# Patient Record
Sex: Male | Born: 1960 | Hispanic: Yes | Marital: Married | Smoking: Never smoker
Health system: Southern US, Community
[De-identification: ages and names within clinical notes are randomized; demographics above are authoritative.]

## PROBLEM LIST (undated history)

## (undated) DIAGNOSIS — M549 Dorsalgia, unspecified: Secondary | ICD-10-CM

---

## 2012-09-01 ENCOUNTER — Observation Stay: Payer: BC Managed Care – PPO

## 2012-09-01 ENCOUNTER — Emergency Department: Payer: BC Managed Care – PPO

## 2012-09-01 ENCOUNTER — Observation Stay: Payer: BC Managed Care – PPO | Admitting: Internal Medicine

## 2012-09-01 ENCOUNTER — Observation Stay
Admission: EM | Admit: 2012-09-01 | Discharge: 2012-09-02 | Disposition: A | Discharge status: Home / Self Care | Payer: BC Managed Care – PPO | Attending: Internal Medicine | Admitting: Internal Medicine

## 2012-09-01 DIAGNOSIS — G459 Transient cerebral ischemic attack, unspecified: Principal | ICD-10-CM | POA: Insufficient documentation

## 2012-09-01 DIAGNOSIS — E860 Dehydration: Secondary | ICD-10-CM | POA: Insufficient documentation

## 2012-09-01 DIAGNOSIS — F411 Generalized anxiety disorder: Secondary | ICD-10-CM | POA: Insufficient documentation

## 2012-09-01 DIAGNOSIS — R569 Unspecified convulsions: Secondary | ICD-10-CM

## 2012-09-01 DIAGNOSIS — F419 Anxiety disorder, unspecified: Secondary | ICD-10-CM

## 2012-09-01 LAB — CBC AND DIFFERENTIAL
Basophils Absolute Automated: 0.01 10*3/uL (ref 0.00–0.20)
Basophils Automated: 0 %
Eosinophils Absolute Automated: 0.33 10*3/uL (ref 0.00–0.70)
Eosinophils Automated: 6 %
Hematocrit: 44.2 % (ref 42.0–52.0)
Hgb: 15.2 g/dL (ref 13.0–17.0)
Immature Granulocytes Absolute: 0.01 10*3/uL
Immature Granulocytes: 0 %
Lymphocytes Absolute Automated: 2.07 10*3/uL (ref 0.50–4.40)
Lymphocytes Automated: 35 %
MCH: 29.4 pg (ref 28.0–32.0)
MCHC: 34.4 g/dL (ref 32.0–36.0)
MCV: 85.5 fL (ref 80.0–100.0)
MPV: 10.7 fL (ref 9.4–12.3)
Monocytes Absolute Automated: 0.49 10*3/uL (ref 0.00–1.20)
Monocytes: 8 %
Neutrophils Absolute: 3.05 10*3/uL (ref 1.80–8.10)
Neutrophils: 51 %
Nucleated RBC: 0 /100 WBC (ref 0–1)
Platelets: 197 10*3/uL (ref 140–400)
RBC: 5.17 10*6/uL (ref 4.70–6.00)
RDW: 14 % (ref 12–15)
WBC: 5.96 10*3/uL (ref 3.50–10.80)

## 2012-09-01 LAB — URINALYSIS WITH MICROSCOPIC
Bilirubin, UA: NEGATIVE
Blood, UA: NEGATIVE
Glucose, UA: NEGATIVE
Ketones UA: NEGATIVE
Leukocyte Esterase, UA: NEGATIVE
Nitrite, UA: NEGATIVE
Protein, UR: NEGATIVE
Specific Gravity UA: 1.016 (ref 1.001–1.035)
Urine pH: 7 (ref 5.0–8.0)
Urobilinogen, UA: NORMAL mg/dL

## 2012-09-01 LAB — BASIC METABOLIC PANEL
BUN: 26 mg/dL — ABNORMAL HIGH (ref 9.0–21.0)
CO2: 23 mEq/L (ref 22–29)
Calcium: 9.3 mg/dL (ref 8.5–10.5)
Chloride: 106 mEq/L (ref 98–107)
Creatinine: 1.1 mg/dL (ref 0.7–1.3)
Glucose: 87 mg/dL (ref 70–100)
Potassium: 4.3 mEq/L (ref 3.5–5.1)
Sodium: 137 mEq/L (ref 136–145)

## 2012-09-01 LAB — CKMB: Creatinine Kinase MB (CKMB): 1.4 ng/mL (ref 0.0–4.9)

## 2012-09-01 LAB — ECG 12-LEAD
Atrial Rate: 80 {beats}/min
P Axis: 63 degrees
P-R Interval: 170 ms
Q-T Interval: 372 ms
QRS Duration: 86 ms
QTC Calculation (Bezet): 429 ms
R Axis: 39 degrees
T Axis: 58 degrees
Ventricular Rate: 80 {beats}/min

## 2012-09-01 LAB — I-STAT TROPONIN: i-STAT Troponin: 0 ng/mL (ref 0.00–0.09)

## 2012-09-01 LAB — CK: Creatine Kinase (CK): 377 U/L — ABNORMAL HIGH (ref 30–200)

## 2012-09-01 LAB — GFR: EGFR: >60

## 2012-09-01 MED ORDER — SODIUM CHLORIDE 0.9 % IV SOLN
INTRAVENOUS | Status: DC
Start: 2012-09-01 — End: 2012-09-02

## 2012-09-01 MED ORDER — ASPIRIN 325 MG PO TABS
325.0000 mg | ORAL_TABLET | Freq: Once | ORAL | Status: AC
Start: 2012-09-01 — End: 2012-09-01
  Administered 2012-09-01: 325 mg via ORAL
  Filled 2012-09-01: qty 1

## 2012-09-01 MED ORDER — GADOBUTROL 1 MMOL/ML IV SOLN
0.1000 mmol/kg | Freq: Once | INTRAVENOUS | Status: AC | PRN
Start: 2012-09-01 — End: 2012-09-01
  Administered 2012-09-01: 7 mmol via INTRAVENOUS
  Filled 2012-09-01: qty 7.5

## 2012-09-01 MED ORDER — OXYCODONE-ACETAMINOPHEN 5-325 MG PO TABS
1.0000 | ORAL_TABLET | ORAL | Status: DC | PRN
Start: 2012-09-01 — End: 2012-09-02

## 2012-09-01 MED ORDER — LABETALOL HCL 5 MG/ML IV SOLN
10.0000 mg | INTRAVENOUS | Status: DC | PRN
Start: 2012-09-01 — End: 2012-09-02

## 2012-09-01 MED ORDER — HEPARIN SODIUM (PORCINE) 5000 UNIT/ML IJ SOLN
5000.0000 [IU] | Freq: Three times a day (TID) | INTRAMUSCULAR | Status: DC
Start: 2012-09-01 — End: 2012-09-02
  Administered 2012-09-01 – 2012-09-02 (×3): 5000 [IU] via SUBCUTANEOUS
  Filled 2012-09-01 (×4): qty 1

## 2012-09-01 MED ORDER — ASPIRIN 325 MG PO TBEC
325.0000 mg | DELAYED_RELEASE_TABLET | Freq: Every day | ORAL | Status: DC
Start: 2012-09-01 — End: 2012-09-02
  Administered 2012-09-01 – 2012-09-02 (×2): 325 mg via ORAL
  Filled 2012-09-01 (×2): qty 1

## 2012-09-01 MED ORDER — SIMVASTATIN 10 MG PO TABS
40.0000 mg | ORAL_TABLET | Freq: Every evening | ORAL | Status: DC
Start: 2012-09-01 — End: 2012-09-02
  Administered 2012-09-01: 40 mg via ORAL
  Filled 2012-09-01: qty 4

## 2012-09-01 NOTE — Progress Notes (Signed)
Pt presents to the ED with an acute onset of AMS at 0730 this am. States he couldn't speak, move or control his mouth. States he was drooling. His wife witnessed the event and states it only lasted 1-2 minutes. No significant PMH. States he did not sleep well last night. He is to have a CT scan of the brain, but does not meet tPA criteria secondary to resolving symptoms. D/W ED MD. Will await results of diagnostic tests to determine POC.

## 2012-09-01 NOTE — ED Notes (Signed)
Bed:S 16<BR> Expected date:<BR> Expected time:<BR> Means of arrival:[id number] - [place]<BR> Comments:<BR>

## 2012-09-01 NOTE — H&P (Signed)
[facility] HOSPITALIST ADMISSION HISTORY AND PHYSICAL EXAM    Date Time: 09/01/2012 11:25 AM  Patient Name: Andrew Boyle  Attending Physician: Balinda Quails Man, MD  Primary Care Physician: Ulyses Amor, MD    CC: Difficulty speaking and confused with generalized stiffness at 7.30 AM today    History of Presenting Illness:   Andrew Boyle is a 52 y.o. male without any significant PMH presents to the hospital with an episode of apparent difficulty speaking, stiffening and mild confusion in morning today. Pt reports very stressful situation with child, has not slept much for the past two nights. Awoke this morning, showered dressed. While preparing to leave the house, patient experienced brief period of difficulty speaking, states he could not get the words out, heard a noise in his head, all of they were lasting for a minute. He had generalized stiffness including face, neck, and all extremities . Wife turned around and found him standing up, holding onto the fridge, She called 911 who told her to put him in bed. Now completely back to baseline. Pt also had small amount of vomiting which appeared to be spit.    No LOC and seizure during this event. No fall/loss of tone.   Denied bowel or bladder incontinence during event or tongue biting  No h/o Chest pain, SOB, focal weakness, slurred speech, paresthesias altered sensation.  No h/o fever, chills, GI/GU symptoms.      Past Medical History:   History reviewed. No pertinent past medical history.    Available old records reviewed    Past Surgical History:   History reviewed. No pertinent past surgical history.    Family History:   History reviewed. No pertinent family history.    Social History:     History     Social History   . Marital Status: Married     Spouse Name: N/A     Number of Children: N/A   . Years of Education: N/A     Social History Main Topics   . Smoking status: Never Smoker    . Smokeless tobacco: Not on file   . Alcohol Use: 0.0  oz/week     3-4 Glasses of wine per week   . Drug Use: No   . Sexually Active: Not on file     Other Topics Concern   . Not on file     Social History Narrative   . No narrative on file       Allergies:     Allergies   Allergen Reactions   . Ciprofloxacin Hives       Medications:     (Not in a hospital admission)    Current Facility-Administered Medications   Medication Dose Route Frequency   . [COMPLETED] aspirin  325 mg Oral Once       Review of Systems:   All other systems were reviewed and are negative    Physical Exam:     Filed Vitals:    09/01/12 1100   BP: 121/80   Pulse: 76   Temp:    Resp: 16   SpO2: 98%       Intake and Output Summary (Last 24 hours) at Date Time  No intake or output data in the 24 hours ending 09/01/12 1125    General: awake, alert, oriented x 3; no acute distress.  HEENT: perrla, eomi, sclera anicteric  oropharynx clear without lesions, mucous membranes moist  Neck: supple, no lymphadenopathy, no thyromegaly, no JVD, no  carotid bruits  Cardiovascular: regular rate and rhythm, no murmurs, rubs or gallops  Lungs: clear to auscultation bilaterally, without wheezing, rhonchi, or rales  Abdomen: soft, non-tender, non-distended; no palpable masses, no hepatosplenomegaly, normoactive bowel sounds, no rebound or guarding  Extremities: no clubbing, cyanosis, or edema  Neuro: cranial nerves grossly intact, strength 5/5 in upper and lower extremities, sensation intact,   Skin: no rashes or lesions noted        Labs:     Results     Procedure Component Value Units Date/Time    CK-MB [[id number]] Collected:09/01/12 0853     Creatinine Kinase MB (CKMB) 1.4 ng/mL Updated:09/01/12 1025    UA with Micro [[id number]] Collected:09/01/12 0955    Specimen Information:Urine Updated:09/01/12 1011     Urine Type Clean Catch      Color, UA Yellow      Clarity, UA Clear      Specific Gravity UA 1.016      Urine pH 7.0      Leukocytes, UA Negative      Nitrite, UA Negative      Protein, UA Negative      Glucose, UA  Negative      Ketones UA Negative      Urobilinogen, UA Normal mg/dL      Bilirubin, UA Negative      Blood, UA Negative      RBC, UA 0 - 5 /HPF      WBC, UA 0 - 5 /HPF      Hyaline Casts, UA 0 - 2 /LPF     Basic Metabolic Panel (BMP) [[id number]]  (Abnormal) Collected:09/01/12 0853    Specimen Information:Blood Updated:09/01/12 1006     Glucose 87 mg/dL      BUN 29.5 (H) mg/dL      Creatinine 1.1 mg/dL      Calcium 9.3 mg/dL      Sodium 188 mEq/L      Potassium 4.3 mEq/L      Chloride 106 mEq/L      CO2 23 mEq/L     CK with MB if Indicated [[id number]]  (Abnormal) Collected:09/01/12 0853    Specimen Information:Blood Updated:09/01/12 1006     Creatine Kinase (CK) 377 (H) U/L     GFR [[id number]] Collected:09/01/12 0853     EGFR >60.0   Updated:09/01/12 1006    CBC with Differential [[id number]] Collected:09/01/12 0853    Specimen Information:Blood / Blood Updated:09/01/12 0949     WBC 5.96 x10 3/uL      RBC 5.17 x10 6/uL      Hgb 15.2 g/dL      Hematocrit 27.0 %      MCV 85.5 fL      MCH 29.4 pg      MCHC 34.4 g/dL      RDW 14 %      Platelets 197 x10 3/uL      MPV 10.7 fL      Neutrophils 51 %      Lymphocytes Automated 35 %      Monocytes 8 %      Eosinophils Automated 6 %      Basophils Automated 0 %      Immature Granulocyte 0 %      Nucleated RBC 0 /100 WBC      Neutrophils Absolute 3.05 x10 3/uL      Abs Lymph Automated 2.07 x10 3/uL      Abs Mono Automated 0.49 x10 3/uL  Abs Eos Automated 0.33 x10 3/uL      Absolute Baso Automated 0.01 x10 3/uL      Absolute Immature Granulocyte 0.01 x10 3/uL     i-Stat Troponin [[id number]] Collected:09/01/12 0853     i-STAT Troponin 0.00 ng/mL Updated:09/01/12 0924      CT head- 1. No intracranial mass, hemorrhage, or hydrocephalus is detected  CXR- No acute process    Imaging personally reviewed      Assessment:   There is no problem list on file for this patient.  1. TIA  2. Dehydration.  3. Anxiety          Plan:   1. TIA work up-  2. IV NS.  3. Place on  Neuro-Observation.  4. AM labs.  5. DVT prophylaxis.          Signed by: Lucillie Garfinkel, MD   IO:[email or web address], Nadeen Landau, MD

## 2012-09-01 NOTE — ED Provider Notes (Signed)
Physician/Midlevel provider first contact with patient: 09/01/12 1610        Physician/Midlevel provider first contact with patient: 09/01/12 9604       Attending Note     Final diagnoses:   None      ED Disposition     None        New Prescriptions    No medications on file          Medications   aspirin tablet 325 mg (not administered)      The patient was seen and examined by the resident and the plan of care was discussed with me. I agree with the plan as it was presented to me. I was present during key portions of any procedures performed. I examined the patient at 9:03 AM.   Chief Complaint   Patient presents with   . Fatigue     Selected Historical Elements: This is a 52 y.o.male who  has no past medical history on file. and presents after an episode this morning of apparent difficulty speaking, stiffening and mild confusion. Pt reports very stressful situation with child, has not slept much for the past two nights. Awoke this morning, showered dressed. While preparing to leave the house, patient experienced brief period of difficulty speaking, states he could not get the words out, heard a noise in his head. Wife turned around and found him standing up, holding onto the fridge, She called 911 who told her to put him in bed. She thinks there was about 8 minutes during which he was not himself. Now completely back to baseline. Pt also had small amount of vomiting which appeared to be spit, per the wife. No LOC during this event, no fall and no loss of tone.     History reviewed. No pertinent past medical history.  History reviewed. No pertinent past surgical history. Current facility-administered medications:aspirin tablet 325 mg, 325 mg, Oral, Once, Lourena Simmonds, Adair Laundry, MD  No current outpatient prescriptions on file.     Patient Vitals for the past 24 hrs:   BP Temp Pulse Resp SpO2   09/01/12 0855 143/86 mmHg - 89  16  97 %   09/01/12 0821 118/69 mmHg 97.7 F (36.5 C) 80  16  99 %     Selected Physical  Examination Elements  Constitutional: Vital signs reviewed. Well appearing.  Head: Normocephalic, atraumatic  Eyes: No conjunctival injection. No discharge.  ENT: Mucous membranes moist  Neck: Normal range of motion. Non-tender.  Respiratory/Chest: Clear to auscultation. No respiratory distress.   Cardiovascular: Regular rate and rhythm. No murmur.   Abdomen: Soft and non-tender. No guarding. No masses or hepatosplenomegaly.  LowerExtremity: No edema. No cyanosis.  Neurological: CN grossly intact, sensation grossly intact throughout. No focal motor deficits by observation. Speech normal. Memory normal.  Skin: Warm and dry. No rash.  Lymphatic: No cervical lymphadenopathy.  Psychiatric: Normal affect. Normal concentration.      Orders Placed This Encounter   Procedures   . CT Head without Contrast   . Chest 2 Views   . Basic Metabolic Panel (BMP)   . CBC with Differential   . CK with MB if Indicated   . UA with Micro   . GFR   . i-Stat Troponin   . i-Stat Troponin   . ECG 12 lead       Will eval for TIA, especially given the strong vascular disease hx in the family. Could also be stress related event. Stroke nurse  to see.       Juliane Poot, MD  09/01/12 [phone]

## 2012-09-01 NOTE — ED Provider Notes (Signed)
Physician/Midlevel provider first contact with patient: 09/01/12 6962         History     Chief Complaint   Patient presents with   . Fatigue     52 yo o/w healthy HM presents biba after acute onset episode of aphasia, paralysis, R temporal "head noise" and discomfort, chest discomfort, SOB, nausea lasting approximately 1 minute associated with dizziness, weakness after resolution requiring him to brace himself against the fridge in order to not fall. Wife called ems shortly thereafter, states that while ems was en route patient was sleepy, "not himself," weak and was more or less at baseline when EMS arrived. Per wife patient was rigid when she went to help him stand and had "frothy" sputum or spit up coming out of his mouth. Denied bowel or bladder incontinence during event or tongue biting. In ED patient is A&Ox4, GCS 15, denies currently SOB, CP, HA, dizziness, weakness, paralysis paresthesias altered sensation but does still feel like he is not himself. Patient states has been under some stress lately. Family history of fatal MI (mother) at the age of 42 years old. Patient denies tobacco, rare ETOH (occasional glass of wine), denies prior surgeries, +ciprofloxacin allergy. Denies fever/chills/emesis/abdominal pain/diarrhea/melena/dysuria/lee /palpitations/orthostatic sx currently. Patient recalls entire event.          History reviewed. No pertinent past medical history.    History reviewed. No pertinent past surgical history.    Family History   Problem Relation Age of Onset   . Heart disease Mother      MI   . Parkinsonism Father        Social  History   Substance Use Topics   . Smoking status: Never Smoker    . Smokeless tobacco: Not on file   . Alcohol Use: 0.0 oz/week     3-4 Glasses of wine per week       .     Allergies   Allergen Reactions   . Ciprofloxacin Hives       Current/Home Medications    No medications on file        Review of Systems   Constitutional: Positive for appetite change. Negative  for fever and chills.   HENT: Negative for congestion, rhinorrhea, neck pain and neck stiffness.    Eyes: Positive for visual disturbance. Negative for photophobia.        Patient states during episode things went "blurry"   Respiratory: Positive for chest tightness and shortness of breath. Negative for cough.    Cardiovascular: Positive for chest pain. Negative for palpitations and leg swelling.   Gastrointestinal: Positive for nausea. Negative for vomiting, abdominal pain, diarrhea, constipation, blood in stool, abdominal distention and anal bleeding.   Genitourinary: Negative for dysuria and enuresis.   Neurological: Positive for dizziness, speech difficulty, weakness and light-headedness. Negative for seizures and headaches.   Psychiatric/Behavioral: Positive for dysphoric mood. Negative for confusion and agitation.   All other systems reviewed and are negative.        Physical Exam    BP 113/74  Pulse 70  Temp 98.1 F (36.7 C)  Resp 20  Ht 1.702 m  Wt 70.308 kg  BMI 24.27 kg/m2  SpO2 96%    Physical Exam   Nursing note and vitals reviewed.  Constitutional: He is oriented to person, place, and time. He appears well-developed and well-nourished.        Patient seems stressed out/anxious   HENT:   Head: Normocephalic and atraumatic.  Mouth/Throat: No oropharyngeal exudate.   Eyes: Conjunctivae normal and EOM are normal. Pupils are equal, round, and reactive to light. No scleral icterus.   Neck: Normal range of motion. Neck supple. No JVD present. No tracheal deviation present.   Cardiovascular: Normal rate, regular rhythm and normal heart sounds.    No murmur heard.  Pulmonary/Chest: Breath sounds normal. No respiratory distress. He has no wheezes. He has no rales.   Abdominal: Soft. Bowel sounds are normal. He exhibits no distension. There is no tenderness. There is no rebound and no guarding.   Musculoskeletal: Normal range of motion. He exhibits no edema and no tenderness.        5/5 strength full ROM  all 4 extremities   Neurological: He is alert and oriented to person, place, and time. No cranial nerve deficit. He exhibits normal muscle tone.        Neuro intact, sensory intact   Skin: Skin is warm and dry. No rash noted. He is not diaphoretic. No erythema.   Psychiatric: His behavior is normal. Judgment and thought content normal.       MDM and ED Course     ED Medication Orders      Start     Status Ordering Provider    09/01/12 0908   aspirin tablet 325 mg   Once      Route: Oral  Ordered Dose: 325 mg         Last MAR action:  Given NASSER, SIGRID K                 MDM  Number of Diagnoses or Management Options  Diagnosis management comments: 52 yo healthy HM biba after acute onset episode of aphasia, paralysis, R temporal sx, chest discomfort, nausea, sob that lasted approximately 1 minute with residual fatigue/tiredness for approximately 8 minutes following event c/w TIA vs focal seizure vs ACS vs acute MI; patient no other medical hx other than strong fam hx CAD (fatal mi mother at 60) and age as risk factors for cardiac etiology, TIA c/w aphasia and paralysis now resolved, rigidity c/w possible focal seizure NOS, stress may also be etiology as patient states did not sleep well the previous day. Will obtain ekg, CE, basic labs, cxr r/o cardiomegaly, head ct r/o acute ischemic stroke/bleed and admit for inpatient obs.    EKG NSR rate 80 pr wnl qrs wnl qt wnl    Procedures    Clinical Impression & Disposition     Clinical Impression  Final diagnoses:   TIA (transient ischemic attack)   Anxiety   Dehydration        ED Disposition     Admit Bed Type: Telemetry [5]  Bed request comments: NEURO/TELE  Admitting Physician: Rodney Booze [[id number]]  Patient Class: Hospital Outpatient Surgery (Amb Proc) [106]             New Prescriptions    No medications on file               Dickey Gave, MD  Resident  09/01/12 4098    Dickey Gave, MD  Resident  09/01/12 1191    Dickey Gave, MD  Resident  09/01/12  1518    Juliane Poot, MD  09/01/12 [phone]

## 2012-09-01 NOTE — ED Notes (Signed)
This morning pt was unable to move and speak for a period of 1-2 min with tightness in his L shoulder and blurry vision. Symptoms resolved now. Pt is under a lot of stress and slept poorly last night. Stroke scale neg and NSR per EMS. A&Ox4.

## 2012-09-01 NOTE — Plan of Care (Signed)
Nursing: patient admitted from ED with a diagnoses of TIA, denies any numbness or tingling MAE, VSS. MRI pending, denies any vison loss or blurred vision. Ambulates independently, I will continue to monitor for changes.

## 2012-09-02 ENCOUNTER — Observation Stay: Payer: BC Managed Care – PPO

## 2012-09-02 LAB — LIPID PANEL
Cholesterol / HDL Ratio: 5.7 Index
Cholesterol: 147 mg/dL (ref 0–199)
HDL: 26 mg/dL — ABNORMAL LOW (ref >40)
LDL Calculated: 100 mg/dL — AB (ref 0–99)
Triglycerides: 107 mg/dL (ref 34–149)
VLDL Calculated: 21 mg/dL (ref 10–40)

## 2012-09-02 LAB — COMPREHENSIVE METABOLIC PANEL
ALT: 32 U/L (ref 0–55)
AST (SGOT): 23 U/L (ref 5–34)
Albumin/Globulin Ratio: 1.3 (ref 0.9–2.2)
Albumin: 3.4 g/dL — ABNORMAL LOW (ref 3.5–5.0)
Alkaline Phosphatase: 65 U/L (ref 40–150)
BUN: 20 mg/dL (ref 9.0–21.0)
Bilirubin, Total: 1 mg/dL (ref 0.2–1.2)
CO2: 21 mEq/L — ABNORMAL LOW (ref 22–29)
Calcium: 8.4 mg/dL — ABNORMAL LOW (ref 8.5–10.5)
Chloride: 107 mEq/L (ref 98–107)
Creatinine: 0.9 mg/dL (ref 0.7–1.3)
Globulin: 2.7 g/dL (ref 2.0–3.6)
Glucose: 80 mg/dL (ref 70–100)
Potassium: 4.2 mEq/L (ref 3.5–5.1)
Protein, Total: 6.1 g/dL (ref 6.0–8.3)
Sodium: 137 mEq/L (ref 136–145)

## 2012-09-02 LAB — CBC
Hematocrit: 39.9 % — ABNORMAL LOW (ref 42.0–52.0)
Hgb: 13.3 g/dL (ref 13.0–17.0)
MCH: 29 pg (ref 28.0–32.0)
MCHC: 33.3 g/dL (ref 32.0–36.0)
MCV: 86.9 fL (ref 80.0–100.0)
MPV: 11 fL (ref 9.4–12.3)
Nucleated RBC: 0 /100 WBC (ref 0–1)
Platelets: 184 10*3/uL (ref 140–400)
RBC: 4.59 10*6/uL — ABNORMAL LOW (ref 4.70–6.00)
RDW: 14 % (ref 12–15)
WBC: 5.99 10*3/uL (ref 3.50–10.80)

## 2012-09-02 LAB — HEMOLYSIS INDEX: Hemolysis Index: 49 Index — ABNORMAL HIGH (ref 0–9)

## 2012-09-02 LAB — GFR: EGFR: >60

## 2012-09-02 LAB — CK: Creatine Kinase (CK): 196 U/L (ref 30–200)

## 2012-09-02 LAB — TSH: TSH: 1.51 u[IU]/mL (ref 0.35–4.94)

## 2012-09-02 MED ORDER — ASPIRIN 81 MG PO TBEC
81.0000 mg | DELAYED_RELEASE_TABLET | Freq: Every day | ORAL | Status: DC
Start: 2012-09-02 — End: 2012-09-02

## 2012-09-02 MED ORDER — ATORVASTATIN CALCIUM 10 MG PO TABS
10.0000 mg | ORAL_TABLET | Freq: Every day | ORAL | Status: DC
Start: 2012-09-02 — End: 2012-09-02

## 2012-09-02 NOTE — Plan of Care (Signed)
Pt is alert and oriented x 4, mae, denies pain, denies neuro symptoms, out of bed with steady gait. Pt is tolerating po intake. Safety measures is in place and reviewed with pt who verbalized understanding. Will continue to monitor for changes.

## 2012-09-02 NOTE — Discharge Instructions (Signed)
Return to ED if chest pain, shortness of breath, nausea, vomiting, headache, fever, confusion or any other concerns.

## 2012-09-02 NOTE — Progress Notes (Signed)
Pt cleared for discharge sent home with paper work and given instructions for follow-up appointments. Pt verbalized understanding of discharge instructions.

## 2012-09-02 NOTE — Discharge Summary (Signed)
Physician Discharge Summary     Patient ID:  Andrew Boyle  [id number]  52 y.o.  1961/03/13    Admit date: 09/01/2012    Discharge date and time: 09/02/12    Admitting Physician: Elany Felix Lafonda Mosses, MD     Discharge Physician: Dr. Sherene Sires    Admission Diagnoses: TIA (transient ischemic attack) [435.9]  95284XLK (transient ischemic [id number]    Discharge Diagnoses: probable seizure  Acute stress reaction       Admission Condition:stable  Discharged Condition: stable  Indication for Admission: rule out stroke    Hospital Course: Andrew Boyle is a 52 y.o. male without any significant PMH presents to the hospital with an episode of apparent difficulty speaking, stiffening and mild confusion in morning today. Pt reports very stressful situation with child, has not slept much for the past two nights. Awoke this morning, showered dressed. While preparing to leave the house, patient experienced brief period of difficulty speaking, states he could not get the words out, heard a noise in his head, all of they were lasting for a minute. He had generalized stiffness including face, neck, and all extremities . Wife turned around and found him standing up, holding onto the fridge, She called 911 who told her to put him in bed. Now completely back to baseline. Pt also had small amount of vomiting which appeared to be spit.   No LOC and seizure during this event. No fall/loss of tone.   Denied bowel or bladder incontinence during event or tongue biting   No h/o Chest pain, SOB, focal weakness, slurred speech, paresthesias altered sensation.   No h/o fever, chills, GI/GU symptoms.   Pt was admitted to [facility] hospitalist service.Head CT and MRI brain was negative for stroke. Neurology was consulted. They recommended outpt EEG. No antiepileptics at this time. Carotid ultrasound was normal.LDL was 100 and HDL was 26. His symptoms resolved completely. After discussion with Dr.Kurukumbi, it was unlikely a TIA. He was not  discharged on statin or aspirin.  He was independently ambulating.     Consults: Neurology, Dr. Becky Sax    Significant Diagnostic Studies:  Results for orders placed during the hospital encounter of 09/01/12   MRI BRAIN W WO CONTRAST    Narrative:     History: TIA.    TECHNIQUE: Multiplanar MR imaging of the brain was performed on a 1.5  [employer]es[employer] MRI scanner. Images were acquired prior to and following the  intravenous administration of 7 cc of Gadavist.    COMPARISON: CT of the brain dated 09/01/2012.    FINDINGS: There is no restricted diffusion to suggest acute infarction.  There is no evidence of acute intracranial hemorrhage. The ventricles  are normal in size without hydrocephalus. There is no mass effect or  midline shift. No extra-axial collections are seen. No areas of abnormal  enhancement are seen within the brain on the postcontrast images. The  corpus callosum is normally formed. The sella is unremarkable. The  cerebellar tonsils are normally positioned. The major intracranial flow  voids are preserved. The globes and orbits appear unremarkable. Mild  mucosal thickening is seen within the paranasal sinuses.      Impression:      No acute intracranial abnormality.    Georgann Housekeeper, MD   09/01/2012 9:04 PM   CT HEAD WO CONTRAST    Narrative:     HISTORY: Post traumatic headaches.     COMPARISON: None.    TECHNIQUE: A non-contrast CT of the  head was performed. This CT study  was performed using the lowest achievable radiation dose to produce a  diagnostic quality CT examination.    FINDINGS: No intracranial mass, hemorrhage, or hydrocephalus is  detected.  The basilar cisterns are clear.  There is a generous cisterna  magna.    The calvarium and skull base are intact. No calvarial or skull base  fracture is seen.  The soft tissues are unremarkable.             The mastoid air cells and the middle ear cavity each appear  aerated bilaterally.     The paranasal sinuses show mild mucosal thickening in the  ethmoid and  maxillary sinus bilaterally. No fluid is seen in the paranasal sinuses.  This includes a 10 mm mucous retention cyst or polyp in the anterior  left maxillary sinus.     The orbits are unremarkable.       Impression:     1. No intracranial mass, hemorrhage, or hydrocephalus is detected.    Theodoro Doing, MD   09/01/2012 10:51 AM     History shortness of breath     Heart size and contour are normal. Lungs are clear with normal pulmonary  vascularity. No pleural effusion, hilar or mediastinal prominence is  evident.  The bony thorax is intact.     IMPRESSION:       Normal study.     Jerald Kief, MD    09/01/2012 9:42 AM  CLINICAL HISTORY: TIA.     Real time imaging of the extracranial carotid arteries demonstrated no  evidence of plaque on either side.     Spectral analysis showed no significant elevation in blood flow velocity  in either right or left internal carotid artery.     Velocities obtained are as follows:  Right CCA distal: 1.0 m/sec  Right ICA peak-systolic: 0.8 m/sec  Right ECA: 0.7 m/sec  Left CCA distal: 1.0 m/sec  Left ICA peak-systolic 1.0 m/sec  Left ECA: 1.0 m/sec     Vertebral arteries are patent with normal directional flow.     IMPRESSION:          Normal study.     Pennelope Bracken, MD    09/02/2012 11:21 AM      Disposition: Home      Medication List      Notice         You have not been prescribed any medications.              Activity: as tolerated  Diet: regular    Follow-up with PCP in one week, Follow up with Neurology in 1 week, Need outpt EEG study    Signed:  Koty Anctil Lafonda Mosses  09/02/2012  4:21 PM

## 2012-09-02 NOTE — Consults (Signed)
Neurology Consultation Note                                       Date Time: 09/02/2012 2:40 PM  Patient Name: Andrew Boyle  Requesting Physician: Rodney Booze, MD  Date of Admission: 09/01/2012    CC:  Difficulty speaking and generalized stiffness yesterday AM      Assessment:     Patient Active Problem List   Diagnosis   . TIA (transient ischemic attack)   . Dehydration   . Anxiety    52yr old, previously healthy male presents with transient aphasia without LOC or loss of awareness. The event preceded by stress and insomnia for 2 days  Etiology unknown at this time.  Possible sz vs TIA.  All s/s resolved.     Plan:    EEG on out pt basis and f/u with Euna Armon in 1-2 wks. (EEG before appt)  No plan to start AED at this time.   Will consider doing ambulatory EEG depending on clinical findings.   Ok to discharge from neurology point of view      HPI   Andrew Boyle is a 52 y.o. male who presents to the hospital with an initial event of difficulty speaking, muscle stiffness, large amt of  drool and "metalic" noises in head.  Pts wife reports event lasting about one minute with gradual return to baseline.  Pt reports knowing where he was / what was happening the entire time.  States first noticed difficulty in speaking.  Knew what he wanted to say but nothing was coming out.  He then describes "metallic noises" and feeling like "there were loose electrical wires in my brain." Wife reports all muscles "being very stiff" with bilat arms "stretched out" infront of him, also large amt of drool during event.   She assisted him in holding the refrigerator while she called "911".  Denies loss of bowel / bladder control.  No bite lacerations to tongue.  No amnesia of event.  Prior to event, there were two days of increased stress with sleep deprivation.  Pt denies heavy use of ETOH, illicit drug use, or h/o TBI / trauma to head. CK was initially elevated on admission has since  returned to normal range.    Since admission, there have been no complaints of SOB, CP, Ha, or similar event.     Reports older brother with h/o sz secondary to parasite.    Carotid Doppler, Head CT, Brain MRI all WNL.    [name]                Past Medical Hx   History reviewed. No pertinent past medical history.       Past Surgical Hx:   History reviewed. No pertinent past surgical history.     Family Medical History:      Family History   Problem Relation Age of Onset   . Heart disease Mother      MI   . Parkinsonism Father    Pt reports older brother with sz d/t parasite  Daughter with Autism / [employer]    Social Hx     History     Social History   . Marital Status: Married     Spouse Name: N/A     Number of Children: N/A   . Years of Education: N/A     Occupational History   .  Not on file.     Social History Main Topics   . Smoking status: Never Smoker    . Smokeless tobacco: Not on file   . Alcohol Use: 0.0 oz/week     3-4 Glasses of wine per week   . Drug Use: No   . Sexually Active: Yes -- Male partner(s)     Other Topics Concern   . Not on file     Social History Narrative   . 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   Meds     Home :   Prior to Admission medications    Not on File      Inpatient :   Current Facility-Administered Medications   Medication Dose Route Frequency   . aspirin EC  325 mg Oral Daily   . heparin (porcine)  5,000 Units Subcutaneous Q8H SCH   . simvastatin  40 mg Oral QHS         Allergies    Ciprofloxacin      Review of Systems     Pt denies Ha, nausea, double vision, shaking episodes.   All other systems were reviewed and are negative except for that mentioned in the HPI    Physical Exam:   Temp:  [96.7 F (35.9 C)-97.4 F (36.3 C)] 96.7 F (35.9 C)  Heart Rate:  [62-68] 64   Resp Rate:  [18] 18   BP: (100-121)/(57-75) 121/75 mmHg     General: On examination today, The patient is well developed and well nourished in no acute distress. Cooperative with the  examination. Blood pressure is 121/75, pulse is 64, temperature is afebrile, and respiratory rate is regular.  There is no pallor, icterus, or cyanosis. There is no pedal edema. The lungs are clear to auscultation. Heart sounds reveal a normal S1 and S2.  Peripheral pulses are intact. Extremities are normal in color.     Mental Status: The patient is oriented to person, place, and time. Recent and remote memory are normal. Attention span, concentration, and language function are normal. There is no evidence of aphasia in conversational speech. There is no agitation, delirium, depressed affect or evidence of delusions or hallucinations. The patients affect is normal.    Cranial Nerves: Pupils are equal, round and reactive. EOM's intact. No nystagmus. Sensation of the face reveals no abnormality. There is no facial asymmetry or weakness. In conversational speech, the patient's hearing is normal. Shoulder shrug is symmetric. The uvula is midline, and the tongue moves normally. No atrophy or fasiculations noted. The rest of his cranial nerve exam from II to XII is grossly normal. Fundoscopic exam attempted but limited secondary to ambient light, pupil size.\      Motor: Muscle strength is 5/5 throughout in both upper and lower extremities. Muscle tone is normal. There is no cogwheel rigidity.       Sensory: Sensory examination is grossly normal to lite touch. No abnormality in upper and lower extremities.     Reflexes: Deep tendon reflexes are 2+ and symmetric. Flexor plantar response. No pathologic reflexes are noted.    Cerebellar: FTN intact bilaterally, there are no spontaneous abnormal movements noted, no tremor, no dysmetria or ataxia.    Gait: Station, gait, and Romberg are normal. Pt can stand on each leg without any difficulty.           Labs:     Results     Procedure Component Value Units Date/Time  CREATINE KINASE LEVEL (CK) [[id number]] Collected:09/02/12 0858    Specimen Information:Blood Updated:09/02/12  0940     Creatine Kinase (CK) 196 U/L     Narrative:    Discharge pending    Lipid panel (Fasting) [[id number]]  (Abnormal) Collected:09/02/12 0414    Specimen Information:Blood Updated:09/02/12 0816     Cholesterol 147 mg/dL      Triglycerides 782 mg/dL      HDL 26 (L) mg/dL      LDL Calculated 956 (A) mg/dL      VLDL Cholesterol Cal 21 mg/dL      CHOL/HDL Ratio 5.7 Index     Hemolyzed Index [[id number]]  (Abnormal) Collected:09/02/12 0414     Hemolyzed Index 49 (H) Index Updated:09/02/12 0816    TSH [[id number]] Collected:09/02/12 0414    Specimen Information:Blood Updated:09/02/12 0719     Thyroid Stimulating Hormone 1.51 uIU/mL     Comprehensive metabolic panel [[id number]]  (Abnormal) Collected:09/02/12 0414    Specimen Information:Blood Updated:09/02/12 0658     Glucose 80 mg/dL      BUN 27.2 mg/dL      Creatinine 0.9 mg/dL      Sodium 536 mEq/L      Potassium 4.2 mEq/L      Chloride 107 mEq/L      CO2 21 (L) mEq/L      Calcium 8.4 (L) mg/dL      Protein, Total 6.1 g/dL      Albumin 3.4 (L) g/dL      AST (SGOT) 23 U/L      ALT 32 U/L      Alkaline Phosphatase 65 U/L      Bilirubin, Total 1.0 mg/dL      Globulin 2.7 g/dL      Albumin/Globulin Ratio 1.3     GFR [[id number]] Collected:09/02/12 0414     EGFR >60.0   Updated:09/02/12 0658    CBC [[id number]]  (Abnormal) Collected:09/02/12 0414    Specimen Information:Blood / Blood Updated:09/02/12 0639     WBC 5.99 x10 3/uL      RBC 4.59 (L) x10 6/uL      Hgb 13.3 g/dL      Hematocrit 43.3 (L) %      MCV 86.9 fL      MCH 29.0 pg      MCHC 33.3 g/dL      RDW 14 %      Platelets 184 x10 3/uL      MPV 11.0 fL      Nucleated RBC 0 /100 WBC           Rads:     Results for orders placed during the hospital encounter of 09/01/12   MRI BRAIN W WO CONTRAST    Narrative:     History: TIA.    TECHNIQUE: Multiplanar MR imaging of the brain was performed on a 1.5  Tesla MRI scanner. Images were acquired prior to and following the  intravenous administration of 7 cc of  Gadavist.    COMPARISON: CT of the brain dated 09/01/2012.    FINDINGS: There is no restricted diffusion to suggest acute infarction.  There is no evidence of acute intracranial hemorrhage. The ventricles  are normal in size without hydrocephalus. There is no mass effect or  midline shift. No extra-axial collections are seen. No areas of abnormal  enhancement are seen within the brain on the postcontrast images. The  corpus callosum is normally formed. The sella is unremarkable. The  cerebellar tonsils are normally positioned. The  major intracranial flow  voids are preserved. The globes and orbits appear unremarkable. Mild  mucosal thickening is seen within the paranasal sinuses.      Impression:      No acute intracranial abnormality.    Georgann Housekeeper, MD   09/01/2012 9:04 PM   CT HEAD WO CONTRAST    Narrative:     HISTORY: Post traumatic headaches.     COMPARISON: None.    TECHNIQUE: A non-contrast CT of the head was performed. This CT study  was performed using the lowest achievable radiation dose to produce a  diagnostic quality CT examination.    FINDINGS: No intracranial mass, hemorrhage, or hydrocephalus is  detected.  The basilar cisterns are clear.  There is a generous cisterna  magna.    The calvarium and skull base are intact. No calvarial or skull base  fracture is seen.  The soft tissues are unremarkable.             The mastoid air cells and the middle ear cavity each appear  aerated bilaterally.     The paranasal sinuses show mild mucosal thickening in the ethmoid and  maxillary sinus bilaterally. No fluid is seen in the paranasal sinuses.  This includes a 10 mm mucous retention cyst or polyp in the anterior  left maxillary sinus.     The orbits are unremarkable.       Impression:     1. No intracranial mass, hemorrhage, or hydrocephalus is detected.    Theodoro Doing, MD   09/01/2012 10:51 AM       Eber Jones A. Rowland Lathe, NP-C  [facility]          Neurology Consulting Attending note:    The patient  was seen and examined by me. History was independently reviewed and confirmed by me. All pertinent parts of the neurological exam we performed and confirmed by me as well.     I agree with the assessment and plan as outlined by the mid-level provider as above, with  additional considerations or recommendations in BOLD and/or dictated.       We will follow the progress as needed or requested, but please do call with questions.        Kennia Vanvorst. M.D  Spectra link: [phone]    [facility]      [facility]: [phone]   [facility]  [phone]

## 2012-09-02 NOTE — Plan of Care (Signed)
Pt AAOx4, calm and cooperative, fc, amb ind in room and halls. Wife at bedside at all times. MAEx4, no c/o N/T. MRI completed-pending results. NS running at 175ml/hr. Plan: fall prevention, pain mgmt, carotid doppler.

## 2012-09-02 NOTE — Progress Notes (Addendum)
MEDICINE PROGRESS NOTE    Date Time: 09/02/2012 8:39 AM  Patient Name: Andrew Boyle  Attending Physician: Rodney Booze, MD    Assessment:     Active Hospital Problems    Diagnosis   . TIA (transient ischemic attack)   . Dehydration   . Anxiety       Plan:   Generalized shaking with difficulty speaking:No postictal state, Stress at home, lack of sleep, MRI and head CT neg, Will consult neurology, May need EEG as outpt. Carotids pending. On aspirin and statin, LDL 100.     Elevated CK likely due shaking: IVF, repeat CKs today. Renal function OK.    DVT ppx: Heparin.     Ambulating independently, no weakness, Doesnot need PT/OT      Case discussed with: Pt    Safety Checklist:     DVT prophylaxis:  CHEST guideline (See page [id number]) Chemical   Foley:  [place] R[facility] Foley protocol Not present   IVs:  Peripheral IV   PT/OT: Not needed   Daily CBC & or Chem ordered:  SHM/ABIM guidelines (see #5) Will d/c as no longer needed   Reference for approximate charges of common labs: CBC auto diff - $76  BMP - $99  Mg - $79    Lines:     Active PICC Line / CVC Line / PIV Line / Drain / Airway / Intraosseous Line / Epidural Line / ART Line / Line / Wound / Pressure Ulcer / NG/OG Tube     Name   Placement date   Placement time   Site   Days    Peripheral IV 09/01/12 Right Antecubital  09/01/12   0856   Antecubital   less than 1           Disposition:     Today's date: 09/02/2012  Length of Stay: 1  Anticipated medical stability for discharge today    Subjective     CC: TIA (transient ischemic attack)    Interval History/24 hour events: No similar events overnight, feels back to normal. No dizziness,No chest pain ,no shortness of breath,no nausea,no headache,no abdominal pain    HPI: Lack of sleep for 2 nights, stress due to daughter, felt metallic noises in his head, couldnot express himself and was shaking with some neck stiffness,no loss of consciousness, no fever, no headache, no numbness or tingling, no bladder or  bowel incontinence, did not fall, wasnot confused after the episode which lasted 30 seconds. Felt that internal organs were  Making noises and then spit came out.      Physical Exam:     VITAL SIGNS PHYSICAL EXAM   Temp:  [96.7 F (35.9 C)-98.1 F (36.7 C)] 96.9 F (36.1 C)  Heart Rate:  [62-89] 64   Resp Rate:  [16-20] 18   BP: (100-143)/(57-86) 103/58 mmHg  Blood Glucose:    Telemetry: no events  No intake or output data in the 24 hours ending 09/02/12 0839 Physical Exam  General: awake, alert X 3  Cardiovascular: regular rate and rhythm, no murmurs, rubs or gallops  Lungs: clear to auscultation bilaterally, without wheezing, rhonchi, or rales  Abdomen: soft, non-tender, non-distended; no palpable masses,  normoactive bowel sounds  Extremities: no edema  Neurology:  Cranial nerves grossly intact, bilateral UE and LE strength 5/5, sensations intact. No meningeal signs       Meds:     Medications were reviewed:    Labs:     Labs (last 72  hours):      Lab 09/02/12 0414 09/01/12 0853   WBC 5.99 5.96   HGB 13.3 15.2   HCT 39.9* 44.2   PLT 184 197       No results found for this basename: PT:2,INR:2,PTT:2 in the last 168 hours   Lab 09/02/12 0414 09/01/12 0853   NA 137 137   K 4.2 4.3   CL 107 106   CO2 21* 23   BUN 20.0 26.0*   CREAT 0.9 1.1   CA 8.4* 9.3   ALB 3.4* --   PROT 6.1 --   BILITOTAL 1.0 --   ALKPHOS 65 --   ALT 32 --   AST 23 --   GLU 80 87                   Microbiology, reviewed and are significant for:  none    Imaging, reviewed and are significant for:  Radiology Results (24 Hour)     Procedure Component Value Units Date/Time    MRI brain with and without contrast [[id number]] Collected:09/01/12 2102    Order Status:Completed  Updated:09/01/12 2108    Narrative:    History: TIA.    TECHNIQUE: Multiplanar MR imaging of the brain was performed on a 1.5  Tesla MRI scanner. Images were acquired prior to and following the  intravenous administration of 7 cc of Gadavist.    COMPARISON: CT of the brain dated  09/01/2012.    FINDINGS: There is no restricted diffusion to suggest acute infarction.  There is no evidence of acute intracranial hemorrhage. The ventricles  are normal in size without hydrocephalus. There is no mass effect or  midline shift. No extra-axial collections are seen. No areas of abnormal  enhancement are seen within the brain on the postcontrast images. The  corpus callosum is normally formed. The sella is unremarkable. The  cerebellar tonsils are normally positioned. The major intracranial flow  voids are preserved. The globes and orbits appear unremarkable. Mild  mucosal thickening is seen within the paranasal sinuses.      Impression:     No acute intracranial abnormality.    Georgann Housekeeper, MD   09/01/2012 9:04 PM    CT Head without Contrast [[id number]] Collected:09/01/12 1048    Order Status:Completed  Updated:09/01/12 1055    Narrative:    HISTORY: Post traumatic headaches.     COMPARISON: None.    TECHNIQUE: A non-contrast CT of the head was performed. This CT study  was performed using the lowest achievable radiation dose to produce a  diagnostic quality CT examination.    FINDINGS: No intracranial mass, hemorrhage, or hydrocephalus is  detected.  The basilar cisterns are clear.  There is a generous cisterna  magna.    The calvarium and skull base are intact. No calvarial or skull base  fracture is seen.  The soft tissues are unremarkable.             The mastoid air cells and the middle ear cavity each appear  aerated bilaterally.     The paranasal sinuses show mild mucosal thickening in the ethmoid and  maxillary sinus bilaterally. No fluid is seen in the paranasal sinuses.  This includes a 10 mm mucous retention cyst or polyp in the anterior  left maxillary sinus.     The orbits are unremarkable.       Impression:      1. No intracranial mass, hemorrhage, or hydrocephalus is detected.  Theodoro Doing, MD   09/01/2012 10:51 AM    Chest 2 Views [[id number]] Collected:09/01/12 0942    Order  Status:Completed  Updated:09/01/12 1914    Narrative:    History shortness of breath    Heart size and contour are normal. Lungs are clear with normal pulmonary  vascularity. No pleural effusion, hilar or mediastinal prominence is  evident.  The bony thorax is intact.      Impression:      Normal study.    Jerald Kief, MD   09/01/2012 9:42 AM              Signed by: Rillie Riffel Lafonda Mosses, MD           Addendum at 6 pm: I tried calledAlpha neuro, PA/NP, no answer, Paged Dr. Gunnar Bulla Pending Neurology consultation

## 2012-09-04 ENCOUNTER — Other Ambulatory Visit (INDEPENDENT_AMBULATORY_CARE_PROVIDER_SITE_OTHER): Payer: Self-pay

## 2012-09-04 DIAGNOSIS — G459 Transient cerebral ischemic attack, unspecified: Secondary | ICD-10-CM

## 2012-09-06 ENCOUNTER — Ambulatory Visit (INDEPENDENT_AMBULATORY_CARE_PROVIDER_SITE_OTHER): Payer: BC Managed Care – PPO | Admitting: Neurology

## 2012-09-06 DIAGNOSIS — G459 Transient cerebral ischemic attack, unspecified: Secondary | ICD-10-CM

## 2012-09-06 DIAGNOSIS — F411 Generalized anxiety disorder: Secondary | ICD-10-CM

## 2012-09-06 DIAGNOSIS — F419 Anxiety disorder, unspecified: Secondary | ICD-10-CM

## 2012-09-06 NOTE — Progress Notes (Signed)
Tech Report   EEG No: 14-136  Hrs of sleep: 6  Dominance: right   Last Meal: pm  Caffeine:  No  Previous EEG: No  Mr. Carden currently has no medications in their medication list.   History: tia  Description: HV, PS, Awake, Drowsy, Asleep and Alert  Activity Noted:  8-10 hz background, LVF anteriorly,  No change with HV, photic driving, generalized slowing with drowsiness, spindles  See Times: 0 Technologist: Henderson Newcomer     Filed charges 09/06/12/PH

## 2012-09-07 ENCOUNTER — Ambulatory Visit (INDEPENDENT_AMBULATORY_CARE_PROVIDER_SITE_OTHER): Payer: BC Managed Care – PPO | Admitting: Neurology

## 2012-09-07 ENCOUNTER — Encounter (INDEPENDENT_AMBULATORY_CARE_PROVIDER_SITE_OTHER): Payer: Self-pay | Admitting: Neurology

## 2012-09-11 NOTE — Procedures (Deleted)
Addendum to EEG report: Excessive beta activity seen throught the recording, this could be secondary to medication effect or of unknown significance.

## 2012-09-11 NOTE — Procedures (Addendum)
Indication / History: The patient has history transient aphasia with body stiffness, without LOC or awareness loss, concerning for seizure vs. TIA    This electroencephalogram was recorded using both referential and differential montages. Using a digital machine the 18 channel International 10-20 System of electrode placement was used.    Medications listed include no AEDs      The EEG was recorded with the patient awake, drowsy and asleep. The waking background activity in the occipital lobe consists of fairly well developed medium voltage 10-11 hz. activity that attenuates with eye opening. Some eye movement artifact and low voltage fast activity is seen frontally.   Drowsiness heralded by disorganization and slowing of the background. Stage II sleep was obtained with symmetric spindle activity and vertex waves.     Abnormal findings: No epileptiform discharges, focal slowing, spike waves or clinical events noted. Occasional rt frontal left temporal sharp transients seen but not amounting to seizure diathesis.    Hemispheric asymmetry : none    Photic stimulation and hyperventilation produced no abnormality.    One lead EKG showed NSR @  90/min, No arrhythmias seen    IMPRESSION:This routine EEG recorded during wakefulness and drowsiness was within normal limits for age. There is no evidence of epileptiform activity or slowing. A normal EEG can be seen in 30-50% of patients with epilepsy. Excessive beta activity seen throught the recording, this could be secondary to medication effect or of unknown significance  Clinical correlation with neurologist is recommended.

## 2012-09-14 ENCOUNTER — Other Ambulatory Visit (INDEPENDENT_AMBULATORY_CARE_PROVIDER_SITE_OTHER): Payer: BC Managed Care – PPO

## 2012-09-14 ENCOUNTER — Encounter (INDEPENDENT_AMBULATORY_CARE_PROVIDER_SITE_OTHER): Payer: Self-pay | Admitting: Neurology

## 2012-09-14 ENCOUNTER — Ambulatory Visit (INDEPENDENT_AMBULATORY_CARE_PROVIDER_SITE_OTHER): Payer: BC Managed Care – PPO | Admitting: Neurology

## 2012-09-14 VITALS — BP 124/76 | HR 72 | Ht 66.0 in | Wt 161.0 lb

## 2012-09-14 DIAGNOSIS — R569 Unspecified convulsions: Secondary | ICD-10-CM

## 2012-09-14 DIAGNOSIS — F438 Other reactions to severe stress: Secondary | ICD-10-CM

## 2012-09-14 NOTE — Progress Notes (Signed)
Subjective:       Patient ID: Andrew Boyle is a 52 y.o. male.    HPI  A 52 year old male who came for evaluation of an episode of questionable  transient ischemic attack versus seizure on September 01, 2012.  The patient  had an episode of ? TIA.  The episode was described he was at home and was  opening the refrigerator.  All of a sudden, he became aphasic, and he was  not talking.  He had a loud scream, witnessed by his wife.  He was mute for  about 10 to 20 seconds.  There was no shaking.  There was tonic stiffening  of the whole body.  There was no fall. No tongue bite.  No wet pants.      After the episode, he was back to baseline, and he did not lose awareness or consciousness during the episode.   Pertinent negative h/o:  There was no diplopia, no dysphasia, no slurring of speech, no facial droop, no focal  weakness, and no numbness noted after the episode.  There was no postictal  confusion, lethargy, or sleep.  He was back to baseline.  He was taken to  the emergency room and was admitted.  He was investigated, and his MRI MRA  was found to be negative.  An EEG was not done.  He was asked to discharge.   He was discharged and followed up on outpatient basis for EEG.     I personally reviewed the MRI MRA of the brain both report and the images.   There is no acute pathology seen.  I also reviewed the EEG done last week.   There were no epileptiform changes suggestive of any seizure diathesis.     He denies any family history of seizure.  He denies any childhood history  of seizures.  He denies any head trauma in the past, requiring  hospitalization or brain surgery in the past.        The following portions of the patient's history were reviewed and updated as appropriate: allergies, current medications, past family history, past medical history, past social history, past surgical history and problem list.    Review of Systems  All systems were reviewed and were negative except as described in the  HPI.        Objective:    Physical Exam  Filed Vitals:    09/14/12 1505   BP: 124/76   Pulse: 72       General: The patient was well developed and well nourished.  No acute distress.  Neck:  no carotid bruits  CVS: RRR, no murmurs, rubs,or gallops  Lungs: clear to auscultation bilaterally  Extremities: no pedal edema, extremities normal in color    Mental Status: The pateint was awake, alert and oriented X4.  Normal affect.  Recent and remote memory appeared to be normal.   Attention span and concentration appear normal.  Fluent without aphasia.   Registers 3/3 and recalls 3/3 after 5 minutes.  Cranial nerves: Pupils are equal, round and reactive to light.  Visual fields full.    EOM intact.  No ptosis.  No diplopia.  No nystagmus.  Facial sensation intact.   Face symmetric.  No dysarthria.  Hearing grossly intact.  Shoulder shrug symmetric.  Tongue protrudes midline.  Uvula is midline.  Fundus: clear, no papilledema, no hemorrhages seen.  Motor: Muscle tone normal. No atrophy.  No fasiculations. No pronator drift.  Strength  R /  L    R / L  Deltoid  5 / 5  Hip Flexion 5 / 5  Triceps  5 / 5   Hip extension 5 / 5  Biceps  5 / 5   Knee flexion 5 / 5  Wrist ext 5 / 5  Knee ext 5 / 5  Wrist flexion 5 / 5  Dorsiflexion 5 / 5  FF   5 / 5  Plantar flexion 5 / 5  IO   5 / 5  Foot inverters 5 / 5  Sensory:   Light touch intact.  Pinprick intact.  Temperature intact.  Vibration intact.  Proprioception intact.  Reflexes:  R / L     R / L  Biceps  2 / 2  Knees  2 / 2  Triceps 2 / 2  Ankles  2 / 2  Brachioradialis2 / 2  Babinksi Down / down  Negative palmomental, glabellar, snout, rooting, or grasp.  Coordination: FTN and HKS intact, no truncal ataxia. RAMs intact. No tremors  Gait: Stable. Good stride length, good initiation.  Intact to tandem, heel, toe walk. Negative Romberg.          Assessment:    The patient is a 52 year old male who presented for episodes of  questionable transient ischemic attack versus seizure on September 02, 2012.   Based on the history and nonfocal neuro exam and review of EEG and MRI of  the brain, MRA of the brain, there is no focal neurovascular abnormality  found. The patient is asymptomatic from one episode. This could be acute  stress reaction and is not calling for any kind of  neuro intervention or  treatment at this time.      His cholesterol levels are LDL 100. Blood pressure 124/76 without any blood  pressure medications.  He is being asked to continue followup with his  primary care doctor for neurology check up.  No aspirin. No cholesterol or  any secondary stroke prevention is recommended at this time.      He will be asked to follow up if any reoccurence of symptoms, otherwise no  need for further follow up.          Plan:       As above.    More than 50% of this 60 minute office visit was spent counseling the patient on one or more of the following:   Disease state - discussion about the medical condition, diagnostic and treatment options   Medications - indications and side effects   Lifestyle issues as appropriate.

## 2012-10-02 ENCOUNTER — Inpatient Hospital Stay (INDEPENDENT_AMBULATORY_CARE_PROVIDER_SITE_OTHER): Payer: BC Managed Care – PPO | Admitting: Neurology

## 2012-11-28 ENCOUNTER — Ambulatory Visit (INDEPENDENT_AMBULATORY_CARE_PROVIDER_SITE_OTHER): Payer: BC Managed Care – PPO | Admitting: Trauma Surgery

## 2012-11-28 ENCOUNTER — Encounter (INDEPENDENT_AMBULATORY_CARE_PROVIDER_SITE_OTHER): Payer: Self-pay | Admitting: Trauma Surgery

## 2012-11-28 VITALS — BP 110/71 | HR 78 | Temp 97.7°F | Wt 156.0 lb

## 2012-11-28 DIAGNOSIS — L723 Sebaceous cyst: Secondary | ICD-10-CM

## 2012-11-28 NOTE — Progress Notes (Signed)
Subjective:   Andrew Boyle is a 52 y.o. male who is here for had a chief complaint of Abscess.    History reviewed. No pertinent past medical history.  History reviewed. No pertinent past surgical history.  Family History   Problem Relation Age of Onset   . Heart disease Mother      MI   . Parkinsonism Father      History     Social History   . Marital Status: Married     Spouse Name: N/A     Number of Children: N/A   . Years of Education: N/A     Occupational History   . Not on file.     Social History Main Topics   . Smoking status: Never Smoker    . Smokeless tobacco: Not on file   . Alcohol Use: 0.0 oz/week     3-4 Glasses of wine per week   . Drug Use: No   . Sexually Active: Yes -- Male partner(s)     Other Topics Concern   . Not on file     Social History Narrative   . No narrative on file     Ciprofloxacin  No current outpatient prescriptions on file.       Review of Systems   Constitutional: Negative.    Skin: Positive for color change.        Painful red swelling left upper arm     Objective:     Filed Vitals:    11/28/12 1016   BP: 110/71   Pulse: 78   Temp: 97.7 F (36.5 C)     Physical Exam   Constitutional: He is oriented to person, place, and time. He appears well-developed and well-nourished. No distress.   HENT:   Head: Normocephalic and atraumatic.   Eyes: Conjunctivae normal and EOM are normal. No scleral icterus.   Cardiovascular: Normal rate and regular rhythm.    Pulmonary/Chest: Effort normal. No respiratory distress.   Neurological: He is alert and oriented to person, place, and time.   Skin: Skin is warm. No rash noted. He is not diaphoretic. There is erythema. No pallor.        1 cm tender cystic swelling in the upper part of the inner aspect of the left upper arm with overlying erythematous skin.   Psychiatric: He has a normal mood and affect.     Assessment:     1. Infected sebaceous cyst        Previous RAD Imaging:  No results found.  Patient Active Problem List     Diagnosis Date Noted   . Infected sebaceous cyst 11/28/2012   . TIA (transient ischemic attack) 09/01/2012   . Dehydration 09/01/2012   . Anxiety 09/01/2012     Clinical picture consistent with infected sebaceous cyst    Plan:   Will excise the cyst today under local anesthesia in the office.    Particia Lather, MD, [facility]  Acute Care Surgeon  (General/Trauma/Critical Care)  [phone]

## 2012-11-28 NOTE — Procedures (Signed)
Pre-op/Postop diagnosis: Infected sebaceous cyst of the left upper arm    Surgeon's name: Particia Lather, MD    Procedure:    - Sterile technique  - Local intradermal injection of 1% Xylocaine over the cyst  - Incision through the scar of the previous I&D done in the past.  - Blunt dissection performed to separate the cyst wall from the overlying skin  - Cyst ruptured and drained foul smelling suppurative sebaceous material  - Cyst wall excised  - Two bleeding points controlled with figure of 8 sutures of 4/0 Vicryl  - Dry gauze applied to open wound and secured with silk tape  - Tolerated well, EBL minimal    Particia Lather, MD, FACS  Acute Care Surgeon  (General/Trauma/Critical Care)  [phone]

## 2012-12-15 ENCOUNTER — Ambulatory Visit (INDEPENDENT_AMBULATORY_CARE_PROVIDER_SITE_OTHER): Payer: BC Managed Care – PPO | Admitting: Trauma Surgery

## 2012-12-28 ENCOUNTER — Ambulatory Visit (INDEPENDENT_AMBULATORY_CARE_PROVIDER_SITE_OTHER): Payer: BC Managed Care – PPO | Admitting: Trauma Surgery

## 2013-03-20 ENCOUNTER — Ambulatory Visit (INDEPENDENT_AMBULATORY_CARE_PROVIDER_SITE_OTHER): Payer: BC Managed Care – PPO | Admitting: Family Medicine

## 2013-03-20 ENCOUNTER — Encounter (INDEPENDENT_AMBULATORY_CARE_PROVIDER_SITE_OTHER): Payer: Self-pay

## 2013-03-20 VITALS — BP 106/70 | HR 63 | Temp 98.7°F | Resp 14 | Ht 68.0 in | Wt 163.0 lb

## 2013-03-20 MED ORDER — HYDROCODONE-ACETAMINOPHEN 5-300 MG PO TABS
1.0000 | ORAL_TABLET | Freq: Three times a day (TID) | ORAL | Status: DC | PRN
Start: 2013-03-20 — End: 2014-03-07

## 2013-03-20 MED ORDER — NAPROXEN 500 MG PO TABS
500.0000 mg | ORAL_TABLET | Freq: Two times a day (BID) | ORAL | Status: DC
Start: 2013-03-20 — End: 2014-03-07

## 2013-03-20 MED ORDER — KETOROLAC TROMETHAMINE 60 MG/2ML IM SOLN
60.00 mg | Freq: Once | INTRAMUSCULAR | Status: AC
Start: 2013-03-20 — End: 2013-03-20
  Administered 2013-03-20: 60 mg via INTRAMUSCULAR

## 2013-03-20 NOTE — Progress Notes (Signed)
Subjective:       Patient ID: Andrew Boyle is a 52 y.o. male.  Chief Complaint   Patient presents with   . Back Pain     X  11 days, RIGHT hip pain from lifting tiles and a ladder. Pt fell a pull on  his hip and pain seem the on/off. Pt recently cough and felt the pull again. Pt is irradiating from hip to knee.    As above.  Started with pain mainly in the right SI area after laying tiles in garage.  Pain seemed to ease with heat and rest, got better and then over the weekend sneezed and got severe pain in lower back and radiated down the right thigh/leg to knee with some numbness/tingling in the right foot.  No bowel or bladder issues. No genital parasthesias. No weakness lower ext.     HPI    The following portions of the patient's history were reviewed and updated as appropriate: allergies, current medications, past family history, past medical history, past social history, past surgical history and problem list.    Review of Systems   Constitutional: Negative.    Gastrointestinal: Negative.    Genitourinary: Negative.    Musculoskeletal: Positive for back pain and gait problem.           Objective:     Physical Exam   Nursing note and vitals reviewed.  Constitutional: He appears well-developed and well-nourished.   Musculoskeletal:        Lumbar back: He exhibits decreased range of motion, tenderness and pain. He exhibits no bony tenderness.        Back:         Positive SLR on right at 30 degrees.  Positive pain right SI with ext rotation left hip.    DTRs 2+ bilat knees/ankles  Motor 5/5  Sensory intact   BP 106/70  Pulse 63  Temp 98.7 F (37.1 C) (Oral)  Resp 14  Ht 1.727 m (5\' 8" )  Wt 73.936 kg (163 lb)  BMI 24.79 kg/m2          Assessment:       Back pain with sciatica.  Appears to be SI joint strain initially, and then overlying sciatica secondary.        Plan:       toradol here  Ice/heat  Referral to PT  Naprosyn  vicodin at bedtime  Follow up with PMD if not improving, sooner if  worsens.      Patient to follow up with primary care doctor or follow up here if symptoms worsening or not resolving as expected.  Patient/family verbalize understanding.  Reviewed discharge instructions that are included here with patient, and printed in AVS. Questions answered.

## 2013-03-20 NOTE — Patient Instructions (Signed)
Sciatica    Sciatica ("Lumbar Radiculopathy") causes a pain that spreads from the lower back down into the buttock, hip and leg. Sometimes leg pain can occur without any back pain. Sciatica is due to irritation or pressure on a spinal nerve as it comes out of the spinal canal. This is most often due to a bulge or rupture of a nearby spinal disk (the cartilage cushion between each spinal bone), which presses on a nearby nerve. Other causes include spinal stenosis (narrowing of the spinal canal) and spasm of the pyriform muscle (a muscle in the buttocks that the sciatic nerve passes through).  Sciatica may begin after a sudden twisting/bending force (such as in a car accident), or sometimes after a simple awkward movement. In either case, muscle spasm is commonly present and contributes to the pain.  The diagnosis of sciatica is made from the symptoms and physical exam. Unless you had a physical injury (such as a car accident or fall), X-rays are usually not ordered for the initial evaluation of sciatica because the nerves and disks cannot be seen on an x-ray. If signs of a compressed nerve are present (for example, loss of tendon reflex or strength in the leg), an MRI (magnetic resonance imaging) scan will need to be scheduled as an outpatient.  Most sciatica (80-90%) gets better with medicine, exercise, physical therapy. If symptoms continue after at least three months of medical treatment, surgery may be considered.  Home Care:  1. You may need to stay in bed the first few days. But, as soon as possible, begin sitting or walking to avoid problems with prolonged bed rest.  2. When in bed, try to find a position of comfort. A firm mattress is best. Try lying flat on your back with pillows under your knees. You can also try lying on your side with your knees bent up towards your chest and a pillow between your knees.  3. Avoid prolonged sitting. This puts more stress on the lower back than standing or  walking.  4. Some persons find relief with heat (hot shower, hot bath or heating pad) and massage, while others prefer cold packs (crushed or cubed ice in a plastic bag, wrapped in a towel). Try both and use the method that feels best for 20 minutes several times a day.  5. You may use acetaminophen (Tylenol) or ibuprofen (Motrin, Advil) to control pain, unless another pain medicine was prescribed. [ NOTE: If you have chronic liver or kidney disease or ever had a stomach ulcer or GI bleeding, talk with your doctor before using these medicines.]  6. Be aware of safe lifting methods and do not lift anything over 15 pounds until all the pain is gone.  Follow Up  with your doctor or this facility if your symptoms do not start to improve after one week. Physical therapy or further testing may be needed.  [NOTE: If X-rays were taken, they will be reviewed by a radiologist. You will be notified of any new findings that may affect your care.]  Get Prompt Medical Attention  if any of the following occur:   Pain becomes worse, not controlled by the prescribed medicine   Weakness or numbness in one or both legs   Numbness in the groin, genital area   Loss of bowel or bladder control   2000-2014 [employer], [address], [place] [address]. All rights reserved. This information is not intended as a substitute for professional medical care. Always follow   your healthcare professional's instructions.

## 2013-03-21 ENCOUNTER — Encounter (INDEPENDENT_AMBULATORY_CARE_PROVIDER_SITE_OTHER): Payer: BC Managed Care – PPO

## 2013-03-21 ENCOUNTER — Ambulatory Visit: Payer: BC Managed Care – PPO | Admitting: Rehabilitative and Restorative Service Providers"

## 2013-03-21 ENCOUNTER — Inpatient Hospital Stay: Payer: BC Managed Care – PPO | Attending: Family Medicine | Admitting: Physical Therapist

## 2013-03-21 VITALS — BP 127/67 | HR 69

## 2013-03-21 DIAGNOSIS — M5126 Other intervertebral disc displacement, lumbar region: Secondary | ICD-10-CM | POA: Insufficient documentation

## 2013-03-21 NOTE — PT/OT Plan of Care (Signed)
Plan of Care / Updated Plan of Care IPTC Medicare Provider #: [phone]                Patient Name: Andrew Boyle  MRN: [id number]  DOI: Onset of Problem / Injury: 03/10/13 DOS: N/A  SOC: 03/21/2013    Diagnosis:     ICD-9-CM   1. Herniated nucleus pulposus, lumbar 722.10       ASSESSMENT: the patient is a 52 y.o. male who requires Physical Therapy for the following:  Impairments: severe pain, difficulty walking, core weakness, right radic with EHL weakness and (+) right SLR    Functional Limitations: have to keep moving, unable to sit or stand > 10', pain awakens from sleep every 2 hours, unable to drive or go to my job    Plan Of Care: Body Mechanics Education, NMR, Proprioceptive Activites, Electrical Stimulation, Instruction in HEP, Traction and Therapeutic Exercise, STM    Frequency/Duration: 2 times a week for 8 weeks. Anticipated D/C date: 05/21/13    Goals:  Date (Body Area, Impairment Goal, Functional   Activity, Target Performance) Time Frame Status Date/  Initial   03/21/2013   Increase core/TrA strength good rating  to allow patient to demonstrate proper lifting technique of 10 lbs from floor for cleaning bath tub and floors.  6 wks  Initial Eval    03/21/2013   Increase trunk flexion to >10 degrees to allow patient to put on shoes/socks with pain level less than 1/10.  4 wks  Initial Eval    03/21/2013   Complete Oswestry and determine goal  1 week  Initial Eval    03/21/2013   centralization  4 wks  Initial Eval    03/21/2013   Patient will demonstrate independence in prescribed HEP with proper form, sets and reps for safe discharge to an independent program.  8 wks  Initial Eval      Signature: Chesley Noon  License # [place] [id number] Date: 03/21/2013    Signature: Lenore Manner, MD ____________________________ Date:

## 2013-03-21 NOTE — PT/OT Therapy Note (Signed)
INITIAL EVALUATION (Lumbar)       Modified per acuteness of SX's      Name: Andrew Boyle Age: 52 y.o. Occupation: Film/video editor SOC: 03/21/2013  Referring Physician: Lenore Manner, MD MD recheck: TBD DOS:   DOI: Onset of Problem / Injury: 03/10/13  # of Authorized Visits:   Visit #   today   Diagnosis (Treating/Medical):     ICD-9-CM   1. Herniated nucleus pulposus, lumbar 722.10          SUBJECTIVE:    Mechanism of Injury: FB to lift 20# tiles in my garage and felt right LBP, strong twinge that intensified 4 days ago into right LE to calf when I sneezed      Patient's reason for seeking PT /Goal: return to gym, golf, TM, walk normally ad return to my job    Past Medical History: No past medical history on file.  Medications: No outpatient prescriptions have been marked as taking for the 03/21/13 encounter (Clinical Support) with Wonda Cerise, PT.   Fall Risks: Low    Other Treatment/Prior Therapy: No  Prior Hospitalization: No  Hand Dominance: Dominant Hand: Right Involved Side: Involved Side: Right   DiagnosticTests: none yet    Outcome Measure:                    % PSFS Score: 87 % Rate Satisfaction with Current Function: 0/10  PLOF: active lifestyle  Living Environment: Type of Residence: Multi-story home      Dwelling Entrance:     Patient lives with: Living Arrangements: Spouse/significant other    OBJECTIVE:    Vitals: BP: 127/67 mmHg Heart Rate: 69         Observation/Posture/Gait/Integumentary  Observation: in obvious acute distress  Posture: Deficits noted: Forward Head and decreased Lumbar Lordosis  Gait: Other: antalgic with SPC on right with twisting, flexion and grimacing, min. WB right  Integumentary: No wound, lesion or rash noted     AROM Hip  WFL and Limitations R hip pain   AROM: Lumbar Spine Pain  R L   Flexion NT'd    Flexion     Extension POE's over a pillow    Extension      R L R L Abduction     Side Bending     Exter. Rot.     Rotation     Inter. Rot.     (blank fields  were intentionally left blank)    Repeated flexion/extension centralizes symptoms? N/A    STRENGTH   Lumbar   MMT /5    IE R IE L R L  IE R IE  L R  L   L1/2 Hip Flex 4 5   Glute Medius       L3 Knee Ext 5 5    T.A. Activation fair      L4 Ankle DF 5 5   Heel/Toe Walk unable      L5 Toe Ex 3+ 5          S1 Knee Flex            (blank fields were intentionally left blank)    Palpation: Pain to palpation: low R L-spine PVM's and multifidus with spasm    Joint Mobility Assessment: hypomobile guarded End feel: Empty    Sensation to Light touch: Intact    Special Tests/Neurological Screen:   R L   Deep Tendon Reflexes: Patella NT NT  Achilles NT NT    Post Tib NT NT     SI Special Tests:    R L   Compression NT NT   Distraction NT NT   March NT NT   Sacral thrust NT NT   Sitting PSIS heights NT NT   Standing Flexion NT NT   Supine ASIS palpation (-) (-)   Thigh thrust NT NT   Innominate Rotation NT NT      R L   SLR (+) (-)   Crossed SLR (+) (-)   Quadrant Test NT NT   [employer] NT NT   OBER Test NT NT   Slump NT NT   FABER NT NT   SAG Sign NT NT   Hamstring Flexibility NT NT   Prone Instability NT NT     Treatment Initial Visit:  Evaluation  Patient Education on acute care and centralization phenomenon, Sleep  and sit spine neutral, TrA all with handouts. PT assessment and POC. Disc pathomechanics with spine model. Gait training with cane in left - unable, pt required wheelchair to get to car due to right gluteal pain with WB  Therapeutic exercise with instruction in HEP and provided patient written and illustrated handout Yes  Manual NV  Modalities: Electrical Stimulation with Ice: Premod 15 min. Location right L-spine Position Prone over 2 pillows f/b POE's   Barriers to rehabilitation: None  Rehab Potential:excellent  Is patient aware of diagnosis: Yes  For Next Visit Add stabilization progression, MT, gait training    Chesley Noon  License # [place] [id number]  03/21/2013  Time In/Out:  1:45 - 2:35   Total  Treatment Time:  23'

## 2013-03-22 ENCOUNTER — Inpatient Hospital Stay: Payer: BC Managed Care – PPO | Attending: Family Medicine

## 2013-03-22 DIAGNOSIS — M5126 Other intervertebral disc displacement, lumbar region: Secondary | ICD-10-CM | POA: Insufficient documentation

## 2013-03-22 NOTE — PT/OT Therapy Note (Signed)
DAILY NOTE   03/22/2013     Time In/Out: 2:20-3:32pm  Total Time: 72 min Visit Number:  2    # of Authorized Visits: 16 Visit #: 2    Diagnosis (Treating/Medical):     ICD-9-CM   1. 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       Subjective:  Andrew Boyle reports a small improvement with pain since yesterday's initial visit but not much - pain is very dependent on position and movement. Went to see the MD today and was told it was HNP, was given Rx for muscle relaxers but hasn't filled it yet because came straight to PT. Hasn't had pain medication since this morning so not feeling "that great" upon arrival to PT.  Functional Changes: None yet - 2nd visit (initial eval yesterday)    Objective:   Treatment:  Therapeutic Exercise:Per flowsheet to improve: Flexibility/ROM, gentle stabilization, gentle mobility  Deferred warm-up today secondary to significant pain and limited tolerance with seated position  Modifications/Patient Education: 1:1 demo and review of HEP (POE and TrA activation) to ensure safe and proper form at home (Verbal, Visual and Tactile cues with TrA to avoid holding breath and increase awareness of core activation)    Functional Activities: Extensive review of bed mobility and positioning to assist with severe pain with transitions and increase tolerance with sleeping. Minimal pain/spasms in prone - no improvement with TrA activation prior to position changes. Mod A W/C to car for safety due to significant pain and spasms leading to R LE instability.     Manual Therapy:   STM/DTM (gradual) at lower thoracic and lumbar PVMs  MFR/gentle manual stretch B QLs  Sacral depression  STM B gluts and R HS and ITB  PA mobs lumbar spine grade 1-3        Current Measurements (ROM, Strength, Girth, Outcomes, etc.):     None today - 2nd visit.    Modalities: Electrical Stim (pre-mod) with HEAT at B lumbar PVMs x 15 mins seated in W/C  Therapy Rationale: Decrease Pain, Decrease Spasm and  Increase Extensiblility       Assessment (response to treatment):   Patient continues to be severely effected by pain limiting ambulation tolerance, transfer ability, and participation with TEs. Extreme functional deficits evidence by observation of functional mobility and Oswestry score. Reviewed simple HEP to ensure safe execution at home and address any modifications needed due to pain. Patient able to demonstrate good form and understanding with HEP and extensive patient education re: bed mobility, pain management, and following MD recommendations re: med schedule. Increased tone at R lumbar PVMs - positive response to manual therapy with less tenderness and decreased soft tissue rigidity throughout lumbar musculature as well as decrease c/o pain but all temporary as once transferred into a seated position, spasm and pain returned. Applied heat and e-stim seated in W/C to relax muscles and limit transfers prior to dismissing from PT. Attempted use of RW instead of SPC secondary to limited tolerance with WBing on R LE but no benefit noted - will attempt again next visit.    Progress towards functional goals: 2nd visit so all ongoing.    Patient requires continued skilled care to: Increase trunk flexion to >10 degrees to allow patient to put on shoes/socks with pain level less than 1/10.     Plan:  Continue with Plan of Care.    Goals:   Date  (Body Area, Impairment Goal, Functional   Activity, Target Performance)  Time Frame  Status  Date/   Initial    03/21/2013  Increase core/TrA strength good rating to allow patient to demonstrate proper lifting technique of 10 lbs from floor for cleaning bath tub and floors.  6 wks  Initial Eval     03/21/2013  Increase trunk flexion to >10 degrees to allow patient to put on shoes/socks with pain level less than 1/10.  4 wks  Initial Eval     03/21/2013  Complete Oswestry and determine goal  1 week  Initial Eval     03/21/2013  centralization  4 wks  Initial Eval      03/21/2013  Patient will demonstrate independence in prescribed HEP with proper form, sets and reps for safe discharge to an independent program.  8 wks  Initial [address] [place], [place] [address]  03/22/2013

## 2013-03-24 NOTE — PT/OT Exercise Plan (Signed)
Name: Andrew Boyle  Referring Physician: Lenore Manner, MD  Diagnosis:     ICD-9-CM   1. 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

## 2013-03-27 ENCOUNTER — Ambulatory Visit: Payer: BC Managed Care – PPO | Admitting: Physical Therapist

## 2013-03-30 ENCOUNTER — Ambulatory Visit: Payer: BC Managed Care – PPO | Admitting: Physical Therapist

## 2013-04-02 ENCOUNTER — Ambulatory Visit: Payer: BC Managed Care – PPO | Admitting: Physical Therapist

## 2013-04-04 ENCOUNTER — Inpatient Hospital Stay: Payer: BC Managed Care – PPO | Attending: Family Medicine

## 2013-04-04 DIAGNOSIS — M5126 Other intervertebral disc displacement, lumbar region: Secondary | ICD-10-CM | POA: Insufficient documentation

## 2013-04-04 NOTE — PT/OT Therapy Note (Signed)
DAILY NOTE   04/04/2013     Time In/Out: 7:40-8:30pm  Total Time: 40 min Visit Number:  3    # of Authorized Visits: 16 Visit #: 3    Diagnosis (Treating/Medical):     ICD-9-CM   1. 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   Functional Changes:pt with a decrease in pain but still reports having limited ability to roll in bed, sleep, and walking with a cane. Pain is less severe  than previous visits.     Objective:   Treatment:  Therapeutic Exercise 20':Per flowsheet to improve: Flexibility/ROM, gentle stabilization, gentle mobility  Deferred warm-up today secondary to significant pain and limited tolerance with seated position  Modifications/Patient Education: educated with proper standing posture in sitting, driving and sleeping(Verbal, Visual and Tactile cues with TrA to avoid holding breath and increase awareness of core activation)  Initiated with LP, bike and supine marching.    Functional Activities: instructed with proper positioning with SL and standing to be able to increase muscle endurance for back extensors.       Manual Therapy 20':   STM/DTM (gradual) at lower thoracic and lumbar PVMs  MFR/gentle manual stretch B QLs  Sacral depression  STM B gluts and R HS and ITB  PA mobs lumbar spine grade 1-3        Current Measurements (ROM, Strength, Girth, Outcomes, etc.):     None today - 2nd visit.    Modalities:HEAT at B lumbar PVMs x 10 mins in longsit position  Therapy Rationale: Decrease Pain, Decrease Spasm and Increase Extensiblility       Assessment (response to treatment):   Pt with a R lateral shift and corrected with METs., able to tol. added exercises with cues to keep spine in netral position and tactile cues to keep abdominal contracted. Radicular pain unchanged post PT session but no increase in pain post additional strengthening exercises. discontinue  e-stim since poor response to treatment.    Progress towards functional goals:--    Patient requires continued skilled care to: --    Plan:  Continue with Plan of Care.    Goals:   Date  (Body Area, Impairment Goal, Functional   Activity, Target Performance)  Time Frame  Status  Date/   Initial    03/21/2013  Increase core/TrA strength good rating to allow patient to demonstrate proper lifting technique of 10 lbs from floor for cleaning bath tub and floors.  6 wks  Initial Eval     03/21/2013  Increase trunk flexion to >10 degrees to allow patient to put on shoes/socks with pain level less than 1/10.  4 wks  Initial Eval     03/21/2013  Complete Oswestry and determine goal  1 week  Initial Eval     03/21/2013  centralization  4 wks  Initial Eval     03/21/2013  Patient will demonstrate independence in prescribed HEP with proper form, sets and reps for safe discharge to an independent program.  8 wks  Initial Nelly Rout, [place] License #[id number]  04/04/2013

## 2013-04-04 NOTE — PT/OT Exercise Plan (Signed)
Name: Andrew Boyle  Referring Physician: Lenore Manner, MD  Diagnosis:     ICD-9-CM   1. 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        Precautions:   Date of Surgery:    MD Follow-up:           Exercise Flow Sheet    Exercise Specifics 03/22/13 04/04/13              bike  unable   4'  aa              HS stretch  unable MT  aa              gastroc stretch  unable MT  aa              POE  2 min jm held              TrA   Supine 10x jm    Prone 10x jm 10''x 10  aa              Bed mobility and positioning  10 mins Reviewed  aa              Gait training  SPC & RW  10 mins --            LP     50#  2'  aa            Supine marching     2'  aa            Hip add and TrA     10"x2'  aa                             Home Exercise Program    Reviewed jm             (Initials = supervised exercise by clinician)

## 2013-04-06 ENCOUNTER — Inpatient Hospital Stay: Payer: BC Managed Care – PPO | Attending: Family Medicine | Admitting: Physical Therapist

## 2013-04-06 DIAGNOSIS — M5126 Other intervertebral disc displacement, lumbar region: Secondary | ICD-10-CM | POA: Insufficient documentation

## 2013-04-06 NOTE — PT/OT Exercise Plan (Signed)
Name: Andrew Boyle  Referring Physician: Lenore Manner, MD  Diagnosis:     ICD-9-CM   1. 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        Precautions:   Date of Surgery:    MD Follow-up:           Exercise Flow Sheet    Exercise Specifics 03/22/13 04/04/13 04/06/13             bike  unable   4'  aa 4' GJ             HS stretch  unable MT  aa Seated EOB 3x30" B           gastroc stretch  unable MT  aa              POE  2 min jm held LTR 2' GJ             TrA   Supine 10x jm    Prone 10x jm 10''x 10  aa              Bed mobility and positioning  10 mins Reviewed  aa            Gait training  SPC & RW  10 mins --            LP     50#  2'  aa            Supine marching     2'  aa            Hip add and TrA     10"x2'  aa                Bridges with TA 2' GJ               Open books 20x B GJ                                                          (Initials = supervised exercise by clinician)

## 2013-04-06 NOTE — PT/OT Therapy Note (Signed)
DAILY NOTE   04/06/2013     Time In/Out: 8:29am to 9:25am   Total Time: 53 min Visit Number:  4    # of Authorized Visits: 16 Visit #: 4    Diagnosis (Treating/Medical):     ICD-9-CM   1. 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 Still has pain in R side and has tingling R calves at night.   Functional Changes: I can walk about 10 mins without back pain now.     Objective:   Treatment:  Therapeutic Exercise:Per flowsheet to improve: Flexibility/ROM, gentle stabilization, gentle mobility  Deferred warm-up today secondary to significant pain and limited tolerance with seated position  Modifications/Patient Education: educated with proper standing posture in sitting, driving and sleeping(Verbal, Visual and Tactile cues with TrA to avoid holding breath and increase awareness of core activation)    Manual Therapy:   Pelvic clearing  Anterior ilial rotation correction L  Manual stretch B glut max and piriformis  Manual stretch R QL in L sidelying.       Current Measurements (ROM, Strength, Girth, Outcomes, etc.):       Modalities: lumbar traction 70/30#, 40/10 on/off, supine 90/90.   Therapy Rationale: Decrease Pain, Decrease Spasm and Increase Extensiblility     Assessment (response to treatment):   Decreased pain at end of session. Demos limited flexibility R gluts. Improved following AROM and stretches. Demos good form with TE with min instruction. Continues to ambulate with SPC with slight lateral trunk lean and guarded walking.     Progress towards functional goals: decreased LBP    Patient requires continued skilled care to: decrease LBP and increase core strength to be able to walk >1 hour without AD without pain.     Plan:  Continue with Plan of Care.    Goals:   Date  (Body Area, Impairment Goal, Functional   Activity, Target Performance)  Time Frame  Status  Date/   Initial    03/21/2013  Increase core/TrA strength good rating to allow  patient to demonstrate proper lifting technique of 10 lbs from floor for cleaning bath tub and floors.  6 wks  Initial Eval     03/21/2013  Increase trunk flexion to >10 degrees to allow patient to put on shoes/socks with pain level less than 1/10.  4 wks  Initial Eval     03/21/2013  Complete Oswestry and determine goal  1 week  Initial Eval     03/21/2013  centralization  4 wks  Initial Eval     03/21/2013  Patient will demonstrate independence in prescribed HEP with proper form, sets and reps for safe discharge to an independent program.  8 wks  Initial [address], PT, DPT [id number]      04/06/2013

## 2013-04-09 ENCOUNTER — Ambulatory Visit: Payer: BC Managed Care – PPO | Admitting: Physical Therapist

## 2013-04-11 ENCOUNTER — Inpatient Hospital Stay: Payer: BC Managed Care – PPO | Attending: Family Medicine

## 2013-04-11 DIAGNOSIS — M5126 Other intervertebral disc displacement, lumbar region: Secondary | ICD-10-CM | POA: Insufficient documentation

## 2013-04-11 NOTE — PT/OT Therapy Note (Signed)
DAILY NOTE   04/11/2013     Time In/Out: 7:45am to 8:35am   Total Time: 40 min Visit Number:  5    # of Authorized Visits: 16 Visit #: 4    Diagnosis (Treating/Medical):     ICD-9-CM   1. 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   Functional Changes: pt reports being able to walk for 15 minutes now and still uses the rolling walker and the cane for ambulation at time for support and no pain with ambulating with RW.  Objective:   Treatment:  Therapeutic Exercise:Per flowsheet to improve: Flexibility/ROM, gentle stabilization, gentle mobility  Deferred warm-up today secondary to significant pain and limited tolerance with seated position  Modifications/Patient Education: (Verbal, Visual and Tactile cues with TrA to avoid holding breath and increase awareness of core activation)  Educated with an added HEP.  Manual Therapy:   Pelvic clearing -SI dysfunction.  Manual stretch B glut max and piriformis  Manual stretch R QL in L sidelying.       Current Measurements (ROM, Strength, Girth, Outcomes, etc.):       Modalities: lumbar traction 70/30#, 40/10 on/off, supine 90/90. (held traction today)  Therapy Rationale: Decrease Pain, Decrease Spasm and Increase Extensiblility     Assessment (response to treatment):   Pt with soreness post PT session and notice increase in muscle time at post hip jt musculature. Pt with limited ER due to muscle tone and educated with an added HEP.  Challenged with balance training.  Progress towards functional goals: --    Patient requires continued skilled care to: --    Plan:  Continue with Plan of Care. Resume traction next visit    Goals:   Date  (Body Area, Impairment Goal, Functional   Activity, Target Performance)  Time Frame  Status  Date/   Initial    03/21/2013  Increase core/TrA strength good rating to allow patient to demonstrate proper lifting technique of 10 lbs from floor for cleaning bath tub  and floors.  6 wks  Initial Eval     03/21/2013  Increase trunk flexion to >10 degrees to allow patient to put on shoes/socks with pain level less than 1/10.  4 wks  Initial Eval     03/21/2013  Complete Oswestry and determine goal  1 week  Initial Eval     03/21/2013  centralization  4 wks  Initial Eval     03/21/2013  Patient will demonstrate independence in prescribed HEP with proper form, sets and reps for safe discharge to an independent program.  8 wks  Initial Nelly Rout, [place] License #[id number]  04/11/2013

## 2013-04-11 NOTE — PT/OT Exercise Plan (Signed)
Name: Andrew Boyle  Referring Physician: Lenore Manner, MD  Diagnosis:     ICD-9-CM   1. 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        Precautions:   Date of Surgery:    MD Follow-up:           Exercise Flow Sheet    Exercise Specifics 03/22/13 04/04/13 04/06/13 04/11/13            bike  unable   4'  aa 4' GJ 4'  aa            HS stretch  unable MT  aa Seated EOB 3x30" B 3x  aa          gastroc stretch  unable MT  aa  MT  aa            POE  2 min jm held LTR 2' GJ -->            TrA   Supine 10x jm    Prone 10x jm 10''x 10  aa  10x  aa            Bed mobility and positioning  10 mins Reviewed  aa            Gait training  SPC & RW  10 mins --  Balance board  2'  Each  aa          LP     50#  2'  aa  2'  aa          Supine marching     2'  aa  -->          Hip add and TrA     10"x2'  aa                Bridges with TA 2' GJ 20x  aa              Open books 20x B GJ                                                          (Initials = supervised exercise by clinician)

## 2013-04-13 ENCOUNTER — Ambulatory Visit: Payer: BC Managed Care – PPO | Admitting: Rehabilitative and Restorative Service Providers"

## 2013-04-17 ENCOUNTER — Inpatient Hospital Stay: Payer: BC Managed Care – PPO | Attending: Family Medicine

## 2013-04-17 DIAGNOSIS — M5126 Other intervertebral disc displacement, lumbar region: Secondary | ICD-10-CM | POA: Insufficient documentation

## 2013-04-17 NOTE — PT/OT Exercise Plan (Signed)
Name: Andrew Boyle  Referring Physician: Lenore Manner, MD  Diagnosis:     ICD-9-CM   1. 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        Precautions:   Date of Surgery:    MD Follow-up:           Exercise Flow Sheet    Exercise Specifics 03/22/13 04/04/13 04/06/13 04/11/13 04/17/13           bike  unable   4'  aa 4' GJ 4'  aa 4'  aa           HS stretch  unable MT  aa Seated EOB 3x30" B 3x  aa MT  aa         gastroc stretch  unable MT  aa  MT  aa MT  aa           POE  2 min jm held LTR 2' GJ --> HEP           TrA   Supine 10x jm    Prone 10x jm 10''x 10  aa  10x  aa 10x  aa           Bed mobility and positioning  10 mins Reviewed  aa   Supine clams  GTB  20x  aa         Gait training  SPC & RW  10 mins --  Balance board  2'  Each  aa 2' each  aa         LP     50#  2'  aa  2'  aa -         Supine marching     2'  aa  --> 2'  aa  2#  -->         Hip add and TrA     10"x2'  aa                Bridges with TA 2' GJ 20x  aa 20x  aa             Open books 20x B GJ                                                          (Initials = supervised exercise by clinician)

## 2013-04-17 NOTE — PT/OT Therapy Note (Signed)
DAILY NOTE   04/17/2013     Time In/Out: 5:00pm to 6:00pm   Total Time: 60 min Visit Number:  6    # of Authorized Visits: 16 Visit #: 6    Diagnosis (Treating/Medical):     ICD-9-CM   1. 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   Functional Changes: able to walk at home and work with less pain now but still unable to sleep for longer than 4 hours and walk for longer periods.  Objective:   Treatment:  Therapeutic Exercise:Per flowsheet to improve: Flexibility/ROM, gentle stabilization, gentle mobility   warm-up today and able to tol sitting position  Modifications/Patient Education: (Verbal, Visual and Tactile cues with TrA to avoid holding breath and increase awareness of core activation)  Resumed traction  Manual Therapy:   Pelvic clearing -SI dysfunction.  Manual stretch B glut max and piriformis  Manual stretch R QL in L sidelying.       Current Measurements (ROM, Strength, Girth, Outcomes, etc.):       Modalities: lumbar traction 70/30#, 40/10 on/off, supine 90/90 x10' .   Therapy Rationale: Decrease Pain, Decrease Spasm and Increase Extensiblility     Assessment (response to treatment):   A decrease in pain on lateral LE and mild radicular pain present down the R LE. Still pain with high bridges and needed cues to keep it minimal. Still poor core active.   Progress towards functional goals: ambulating without and AD.    Patient requires continued skilled care to: --    Plan:  Continue with Plan of Care.   Goals:   Date  (Body Area, Impairment Goal, Functional   Activity, Target Performance)  Time Frame  Status  Date/   Initial    03/21/2013  Increase core/TrA strength good rating to allow patient to demonstrate proper lifting technique of 10 lbs from floor for cleaning bath tub and floors.  6 wks  Initial Eval     03/21/2013  Increase trunk flexion to >10 degrees  to allow patient to put on shoes/socks with pain level less than 1/10.  4 wks  Initial Eval     03/21/2013  Complete Oswestry and determine goal  1 week  Initial Eval     03/21/2013  centralization  4 wks  Initial Eval     03/21/2013  Patient will demonstrate independence in prescribed HEP with proper form, sets and reps for safe discharge to an independent program.  8 wks  Initial Nelly Rout, [place] License #[id number]  04/17/2013

## 2013-04-19 ENCOUNTER — Inpatient Hospital Stay: Payer: BC Managed Care – PPO | Attending: Family Medicine

## 2013-04-19 DIAGNOSIS — M5126 Other intervertebral disc displacement, lumbar region: Secondary | ICD-10-CM | POA: Insufficient documentation

## 2013-04-19 NOTE — PT/OT Exercise Plan (Signed)
Name: Allie Bossier  Referring Physician: Lenore Manner, MD  Diagnosis:     ICD-9-CM   1. 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        Precautions:   Date of Surgery:    MD Follow-up:           Exercise Flow Sheet    Exercise Specifics 03/22/13 04/04/13 04/06/13 04/11/13 04/17/13 04/19/13          bike  unable   4'  aa 4' GJ 4'  aa 4'  aa 4'  aa          HS stretch  unable MT  aa Seated EOB 3x30" B 3x  aa MT  aa MT  aa        gastroc stretch  unable MT  aa  MT  aa MT  aa MT  aa          POE  2 min jm held LTR 2' GJ --> HEP --          TrA   Supine 10x jm    Prone 10x jm 10''x 10  aa  10x  aa 10x  aa 10x  aa          Bed mobility and positioning  10 mins Reviewed  aa   Supine clams  GTB  20x  aa -        Gait training  SPC & RW  10 mins --  Balance board  2'  Each  aa 2' each  aa 2'  Each  aa        LP     50#  2'  aa  2'  aa - Wall squats  2'  aa        Supine marching     2'  aa  --> 2'  aa  2#  --> HEP  Side stepping  Green   2'  aa        Hip add and TrA     10"x2'  aa    -            Bridges with TA 2' GJ 20x  aa 20x  aa 20x  aa            Open books 20x B GJ   -                                                       (Initials = supervised exercise by clinician)

## 2013-04-19 NOTE — PT/OT Therapy Note (Signed)
DAILY NOTE   04/19/2013     Time In/Out: 9:15am to 10:10pm   Total Time: 55 min Visit Number:  7    # of Authorized Visits: 16 Visit #: 6    Diagnosis (Treating/Medical):     ICD-9-CM   1. 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   Functional Changes: he is now waking up twice a night. Still difficult to stand for long periods at work.  Objective:   Treatment:  Therapeutic Exercise:Per flowsheet to improve: Flexibility/ROM, gentle stabilization, gentle mobility   warm-up today and able to tol sitting position  Modifications/Patient Education: (Verbal, Visual and Tactile cues with TrA to avoid holding breath and increase awareness of core activation)  Added a HEP.  Manual Therapy:   Pelvic clearing -SI dysfunction.  Manual stretch B glut max and piriformis  Manual stretch R QL in L sidelying.       Current Measurements (ROM, Strength, Girth, Outcomes, etc.):       Modalities: lumbar traction 70/30#, 40/10 on/off, supine 90/90 x10' .   Therapy Rationale: Decrease Pain, Decrease Spasm and Increase Extensiblility     Assessment (response to treatment):   A decrease in radicular pain present since initiated traction treatment and mild discomfort with mild radicular pain with prone hip ext. Still limited ROM present with HS length; noted with MT Hs stretching.   No lateral shift present with standing assessment.   Patient requires continued skilled care [id number] with core training to be able to stand for long periods at work.  Plan:  Continue with Plan of Care.   Goals:   Date  (Body Area, Impairment Goal, Functional   Activity, Target Performance)  Time Frame  Status  Date/   Initial    03/21/2013  Increase core/TrA strength good rating to allow patient to demonstrate proper lifting technique of 10 lbs from floor for cleaning bath tub and floors.  6 wks  Initial Eval     03/21/2013   Increase trunk flexion to >10 degrees to allow patient to put on shoes/socks with pain level less than 1/10.  4 wks  Initial Eval     03/21/2013  Complete Oswestry and determine goal  1 week  Initial Eval     03/21/2013  centralization  4 wks  Initial Eval     03/21/2013  Patient will demonstrate independence in prescribed HEP with proper form, sets and reps for safe discharge to an independent program.  8 wks  Initial Nelly Rout, [place] License #[id number]  04/19/2013

## 2013-04-24 ENCOUNTER — Inpatient Hospital Stay: Payer: BC Managed Care – PPO | Attending: Family Medicine

## 2013-04-24 DIAGNOSIS — M5126 Other intervertebral disc displacement, lumbar region: Secondary | ICD-10-CM | POA: Insufficient documentation

## 2013-04-24 NOTE — PT/OT Therapy Note (Signed)
DAILY NOTE   04/24/2013     Time In/Out: 2:55 to 3:45  Total Time: 55 min Visit Number:  8    # of Authorized Visits: 16 Visit #: 7    Diagnosis (Treating/Medical):     ICD-9-CM   1. 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 He reports compliece with HEP and stretches.    Functional Changes: pt reports having less radicular pain now which is helping with walking longer distances and sleeping better at night.  Objective:   Treatment:  Therapeutic Exercise:Per flowsheet to improve: Flexibility/ROM, gentle stabilization, gentle mobility   warm-up today and able to tol sitting position  Modifications/Patient Education: (Verbal, Visual and Tactile cues with TrA to avoid holding breath and increase awareness of core activation)  Cues to activate core still needed with all WB activities.  Manual Therapy: .  Manual stretch B glut max and piriformis  Manual stretch R QL in L sidelying.       Current Measurements (ROM, Strength, Girth, Outcomes, etc.):     HS ROM R: 65 deg L: 65 deg  Modalities: lumbar traction 70/30#, 40/10 on/off, supine 90/90 x10' .   Therapy Rationale: Decrease Pain, Decrease Spasm and Increase Extensiblility     Assessment (response to treatment):   An increase in HS ROM and reports increase in muscle fatigue post TE. A decrease in post hip pain post MT and needed cues still to activate core.      Patient requires continued skilled care [id number] with core training to be able to stand for long periods at work without having to take frequent breaks or lean forward..  Plan:  Continue with Plan of Care. Assess next time since possible [place]  Goals:   Date  (Body Area, Impairment Goal, Functional   Activity, Target Performance)  Time Frame  Status  Date/   Initial    03/21/2013  Increase core/TrA strength good rating to allow patient to demonstrate proper lifting technique of 10 lbs  from floor for cleaning bath tub and floors.  6 wks  Initial Eval     03/21/2013  Increase trunk flexion to >10 degrees to allow patient to put on shoes/socks with pain level less than 1/10.  4 wks  Initial Eval     03/21/2013  Complete Oswestry and determine goal  1 week  Initial Eval     03/21/2013  centralization  4 wks  Initial Eval     03/21/2013  Patient will demonstrate independence in prescribed HEP with proper form, sets and reps for safe discharge to an independent program.  8 wks  Initial Nelly Rout, [place] License #[id number]  04/24/2013

## 2013-04-24 NOTE — PT/OT Exercise Plan (Signed)
Name: Allie Bossier  Referring Physician: Lenore Manner, MD  Diagnosis:     ICD-9-CM   1. 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        Precautions:   Date of Surgery:    MD Follow-up:           Exercise Flow Sheet    Exercise Specifics 03/22/13 04/04/13 04/06/13 04/11/13 04/17/13 04/19/13 04/24/13         bike  unable   4'  aa 4' GJ 4'  aa 4'  aa 4'  aa 4'  aa         HS stretch  unable MT  aa Seated EOB 3x30" B 3x  aa MT  aa MT  aa MT  aa       gastroc stretch  unable MT  aa  MT  aa MT  aa MT  aa MT  aa         POE  2 min jm held LTR 2' GJ --> HEP --          TrA   Supine 10x jm    Prone 10x jm 10''x 10  aa  10x  aa 10x  aa 10x  aa          Bed mobility and positioning  10 mins Reviewed  aa   Supine clams  GTB  20x  aa -        Gait training  SPC & RW  10 mins --  Balance board  2'  Each  aa 2' each  aa 2'  Each  aa 2'  Each by rail       LP     50#  2'  aa  2'  aa - Wall squats  2'  aa 2 with UEs  5" hold  aa       Supine marching     2'  aa  --> 2'  aa  2#  --> HEP  Side stepping  Green   2'  aa Side stepping  2'    aa       Hip add and TrA     10"x2'  aa    - -           Bridges with TA 2' GJ 20x  aa 20x  aa 20x  aa 20x with p-ball  aa           Open books 20x B GJ   -                                                       (Initials = supervised exercise by clinician)

## 2013-04-26 ENCOUNTER — Inpatient Hospital Stay: Payer: BC Managed Care – PPO | Attending: Family Medicine

## 2013-04-26 DIAGNOSIS — M5126 Other intervertebral disc displacement, lumbar region: Secondary | ICD-10-CM

## 2013-04-26 NOTE — PT/OT Therapy Note (Signed)
DAILY NOTE   04/26/2013     Time In/Out: 9:15 to 10:10  Total Time: 45 min Visit Number:  9    # of Authorized Visits: 16 Visit #: 9    Diagnosis (Treating/Medical):     ICD-9-CM   1. 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     Subjective:  Erhardt reports a decrease in post hip pain intensity since PT and manual therapy. He reports compliece with HEP and stretches.    Functional Changes: pt continue with reports progress with PT and is able to sleep better now, no longer constant pain but discomfort.  Objective:   Treatment:  Therapeutic Exercise:Per flowsheet to improve: Flexibility/ROM, gentle stabilization, gentle mobility   only warm-up today Modifications/Patient Education: (Verbal, Visual and Tactile cues with TrA to avoid holding breath and increase awareness of core activation)    Manual Therapy: .  Manual stretch B glut max and piriformis  Manual stretch R QL in L sidelying.   Lumbar reassessment       Current Measurements (ROM, Strength, Girth, Outcomes, etc.):   Initial:       Oswestry Score: 86            %  PSFS Score: 87% Rate Satisfaction with Current Function: 0/10    Final:       Oswestry Score: 36            %  PSFS Score: 40 % Rate Satisfaction with Current Function: 3/10    Stacy reports performing HEP every day   Toe ext L: 4+/5  R 5/5  Hip flexion L4+/5  R5/5  L5-S1 reflexes WNL  HS ROM R: 65 deg L: 65 deg  TrA: good   Modalities: lumbar traction 70/30#, 40/10 on/off, supine 90/90 x10' .   Therapy Rationale: Decrease Pain, Decrease Spasm and Increase Extensiblility     Assessment (response to treatment):   Increase in LE strength with only mild weakness with toe ext and eversion. Still mild weakness present with hip flexion and pain only with deep pressure around PSIS.      SI and  L5 reflexes WNL  Patient requires continued skilled care to--  Plan:  Discharged from P.T./O.T. Goals Met.   Goals:   Date  (Body Area, Impairment Goal, Functional   Activity,  Target Performance)  Time Frame  Status  Date/   Initial    03/21/2013  Increase core/TrA strength good rating to allow patient to demonstrate proper lifting technique of 10 lbs from floor for cleaning bath tub and floors.  6 wks  Initial Eval     03/21/2013  Increase trunk flexion to >10 degrees to allow patient to put on shoes/socks with pain level less than 1/10.  4 wks  Initial Eval     03/21/2013  Complete Oswestry and determine goal  1 week  Initial Eval     03/21/2013  centralization  4 wks  Initial Eval     03/21/2013  Patient will demonstrate independence in prescribed HEP with proper form, sets and reps for safe discharge to an independent program.  8 wks  Initial Nelly Rout, [place] License #[id number]  04/26/2013

## 2013-05-01 ENCOUNTER — Other Ambulatory Visit: Payer: Self-pay | Admitting: Physical Medicine & Rehabilitation

## 2013-05-01 DIAGNOSIS — M545 Low back pain, unspecified: Secondary | ICD-10-CM

## 2013-05-08 ENCOUNTER — Ambulatory Visit: Payer: BC Managed Care – PPO

## 2013-06-18 ENCOUNTER — Emergency Department: Payer: BC Managed Care – PPO

## 2013-06-18 ENCOUNTER — Emergency Department
Admission: EM | Admit: 2013-06-18 | Discharge: 2013-06-18 | Disposition: A | Discharge status: Home / Self Care | Payer: BC Managed Care – PPO | Attending: Emergency Medical Services | Admitting: Emergency Medical Services

## 2013-06-18 DIAGNOSIS — R42 Dizziness and giddiness: Secondary | ICD-10-CM | POA: Insufficient documentation

## 2013-06-18 HISTORY — DX: Dorsalgia, unspecified: M54.9

## 2013-06-18 LAB — CBC AND DIFFERENTIAL
Basophils Absolute Automated: 0.01 10*3/uL (ref 0.00–0.20)
Basophils Automated: 0 %
Eosinophils Absolute Automated: 0.08 10*3/uL (ref 0.00–0.70)
Eosinophils Automated: 2 %
Hematocrit: 42.4 % (ref 42.0–52.0)
Hgb: 14.6 g/dL (ref 13.0–17.0)
Immature Granulocytes Absolute: 0.01 10*3/uL
Immature Granulocytes: 0 %
Lymphocytes Absolute Automated: 1.46 10*3/uL (ref 0.50–4.40)
Lymphocytes Automated: 27 %
MCH: 29.7 pg (ref 28.0–32.0)
MCHC: 34.4 g/dL (ref 32.0–36.0)
MCV: 86.2 fL (ref 80.0–100.0)
MPV: 10.9 fL (ref 9.4–12.3)
Monocytes Absolute Automated: 0.34 10*3/uL (ref 0.00–1.20)
Monocytes: 6 %
Neutrophils Absolute: 3.56 10*3/uL (ref 1.80–8.10)
Neutrophils: 65 %
Nucleated RBC: 0 (ref 0–1)
Platelets: 184 10*3/uL (ref 140–400)
RBC: 4.92 10*6/uL (ref 4.70–6.00)
RDW: 14 % (ref 12–15)
WBC: 5.45 10*3/uL (ref 3.50–10.80)

## 2013-06-18 LAB — URINALYSIS, REFLEX TO MICROSCOPIC EXAM IF INDICATED
Bilirubin, UA: NEGATIVE
Blood, UA: NEGATIVE
Glucose, UA: NEGATIVE
Ketones UA: NEGATIVE
Leukocyte Esterase, UA: NEGATIVE
Nitrite, UA: NEGATIVE
Protein, UR: NEGATIVE
Specific Gravity UA: 1.006 (ref 1.001–1.035)
Urine pH: 6 (ref 5.0–8.0)
Urobilinogen, UA: NORMAL mg/dL

## 2013-06-18 LAB — BASIC METABOLIC PANEL
Anion Gap: 5 (ref 5.0–15.0)
BUN: 22 mg/dL — ABNORMAL HIGH (ref 9–21)
CO2: 28 mEq/L (ref 22–29)
Calcium: 9.7 mg/dL (ref 8.5–10.5)
Chloride: 106 mEq/L (ref 98–107)
Creatinine: 0.9 mg/dL (ref 0.7–1.3)
Glucose: 99 mg/dL (ref 70–100)
Potassium: 4.2 mEq/L (ref 3.5–5.1)
Sodium: 139 mEq/L (ref 136–145)

## 2013-06-18 LAB — GFR: EGFR: >60

## 2013-06-18 MED ORDER — SODIUM CHLORIDE 0.9 % IV BOLUS
1000.0000 mL | Freq: Once | INTRAVENOUS | Status: AC
Start: 2013-06-18 — End: 2013-06-18
  Administered 2013-06-18: 1000 mL via INTRAVENOUS

## 2013-06-18 NOTE — ED Provider Notes (Signed)
Physician/Midlevel provider first contact with patient: 06/18/13 1300         [facility] EMERGENCY DEPARTMENT HISTORY AND PHYSICAL EXAM    Patient Name: Andrew Boyle, Andrew Boyle  Encounter Date:  06/18/2013  Rendering Provider: Bonner Puna, MD  Patient DOB:  12-04-1960  MRN:  [id number]    History of Presenting Illness     Historian: Patient    54 y.o. male h/o herniated disc presents via EMS w/ resolving lightheadedness that began at 0800 this AM while he was at work. Pt states he felt like he was "in slow motion," and like he might pass out. He also notes he was having difficulty finding his words when he was speaking to his wife, and that he could not easily walk in a straight line. No relief w/ fresh air and walking around. He states he feels better, but that he still has some symptoms when he moves his head. Of note, pt has been under a lot of stress and having difficulty sleeping. He also had similar symptoms 29mo ago, and had an unremarkable workup to rule out a TIA. He was also having trouble sleeping at that time. No vision abnormalities, slurred speech, HA, or n/v/d.     PMD:  Ulyses Amor, MD    Past Medical History     Past Medical History   Diagnosis Date   . Back pain        Past Surgical History     History reviewed. No pertinent past surgical history.    Family History     Family History   Problem Relation Age of Onset   . Heart disease Mother      MI   . Parkinsonism Father        Social History     History     Social History   . Marital Status: Married     Spouse Name: N/A     Number of Children: N/A   . Years of Education: N/A     Social History Main Topics   . Smoking status: Never Smoker    . Smokeless tobacco: Not on file   . Alcohol Use: 0.0 oz/week     3-4 Glasses of wine per week   . Drug Use: No   . Sexually Active: Yes -- Male partner(s)     Other Topics Concern   . Not on file     Social History Narrative   . No narrative on file       Home Medications     Home  medications reviewed by ED MD at 1:00 PM     Previous Medications    HYDROCODONE-ACETAMINOPHEN 5-300 MG TAB    Take 1 tablet by mouth 3 (three) times daily as needed (at bedtime mainly).    IBUPROFEN (ADVIL,MOTRIN) 200 MG TABLET    Take 200 mg by mouth every 6 (six) hours as needed.    NAPROXEN (NAPROSYN) 500 MG TABLET    Take 1 tablet (500 mg total) by mouth 2 (two) times daily with meals.       Review of Systems     Constitutional:  No fever  Eyes: No discharge   ENT: No ST  CV:  No CP   Resp:  No SOB or cough  GI: No abd pain, N, V, D  GU: No dysuria  MS:    Skin: No rash  Neuro:  +lightheadedness, +unsteadiness, +difficulty articulating, No HA, No vision abnormalities.  Psych:  No behavior changes  All other systems reviewed and negative    Physical Exam     BP 132/74  Pulse 72  Temp 97.7 F (36.5 C)  Resp 18  SpO2 100%    CONSTITUTIONAL: Vital signs reviewed, Well appearing, Alert and oriented X 3.   HEAD: Atraumatic, Normocephalic.   EYES: Eyes are normal to inspection, Pupils equal, round and reactive to light, Sclera are normal, Conjunctiva are normal.   ENT: Nose examination normal, Posterior pharynx normal, Mouth normal to inspection.   NECK: Normal ROM.   RESPIRATORY CHEST: Chest is nontender, Breath sounds normal, No respiratory distress.   CARDIOVASCULAR: RRR, No murmurs.   ABDOMEN: Abdomen is nontender, Bowel sounds normal, No distension, No peritoneal signs.   BACK: There is no CVA Tenderness, There is no tenderness to palpation.   NEURO: GCS is 15, No focal motor deficits, No focal sensory deficits, No cerebellar deficits, Cranial nerves 2-12 intact, Face symmetric, Speech normal.   SKIN: Skin is warm, Skin is dry, Skin is normal color.   PSYCHIATRIC: Oriented X 3, Normal affect, Normal insight.     ED Medications Administered     ED Medication Orders      Start     Status Ordering Provider    06/18/13 1304   sodium chloride 0.9 % bolus 1,000 mL   Once      Route: Intravenous  Ordered Dose:  1,000 mL         Last MAR action:  New Bag Bryttani Blew SELF                Orders Placed During This Encounter     Orders Placed This Encounter   Procedures   . XR Chest AP Portable   . CT Head without Contrast   . CBC and differential   . Basic Metabolic Panel   . UA, Reflex to Microscopic   . GFR   . May be transported off monitor   . ECG 12 Lead   . Saline lock IV       Diagnostic Study Results     The results of the diagnostic studies below were reviewed by the ED provider:    Labs  Results     Procedure Component Value Units Date/Time    Basic Metabolic Panel [[id number]]  (Abnormal) Collected:06/18/13 1324    Specimen Information:Blood Updated:06/18/13 1350     Glucose 99 mg/dL      BUN 22 (H) mg/dL      Creatinine 0.9 mg/dL      CALCIUM 9.7 mg/dL      Sodium 409 mEq/L      Potassium 4.2 mEq/L      Chloride 106 mEq/L      CO2 28 mEq/L      Anion Gap 5.0     GFR [[id number]] Collected:06/18/13 1324     EGFR >60.0 Updated:06/18/13 1350    UA, Reflex to Microscopic [[id number]] Collected:06/18/13 1324    Specimen Information:Urine Updated:06/18/13 1334     Urine Type Clean Catch      Color, UA Colorle      Clarity, UA Clear      Specific Gravity UA 1.006      Urine pH 6.0      Leukocyte Esterase, UA Negative      Nitrite, UA Negative      Protein, UR Negative      Glucose, UA Negative      Ketones UA Negative  Urobilinogen, UA Normal mg/dL      Bilirubin, UA Negative      Blood, UA Negative     CBC and differential [[id number]] Collected:06/18/13 1324    Specimen Information:Blood / Blood Updated:06/18/13 1333     WBC 5.45 x10 3/uL      RBC 4.92 x10 6/uL      Hgb 14.6 g/dL      Hematocrit 47.8 %      MCV 86.2 fL      MCH 29.7 pg      MCHC 34.4 g/dL      RDW 14 %      Platelets 184 x10 3/uL      MPV 10.9 fL      Neutrophils 65 %      Lymphocytes Automated 27 %      Monocytes 6 %      Eosinophils Automated 2 %      Basophils Automated 0 %      Immature Granulocyte 0 %      Nucleated RBC 0      Neutrophils  Absolute 3.56 x10 3/uL      Abs Lymph Automated 1.46 x10 3/uL      Abs Mono Automated 0.34 x10 3/uL      Abs Eos Automated 0.08 x10 3/uL      Absolute Baso Automated 0.01 x10 3/uL      Absolute Immature Granulocyte 0.01 x10 3/uL           Radiologic Studies  Radiology Results (24 Hour)     Procedure Component Value Units Date/Time    CT Head without Contrast [[id number]] Collected:06/18/13 1453    Order Status:Completed  Updated:06/18/13 1502    Narrative:    CLINICAL HISTORY: Dizziness      COMPARISON: MRI brain on 08/24/2012    FINDINGS:  CT examination of the head was performed using standard  technique.      There are no intracranial hemorrhages or abnormal fluid collections.  There are no masses. There is no mass effect or midline shift.  Ventricles and sulci are normal for age. The gray-white matter junctions  are normal. There is no evidence of an acute infarct. The included  portions of the intraorbital structures are normal.      Images at bone window settings demonstrate no fractures or other  abnormalities.        Impression:      Normal CT examination of the head. No change from prior  brain MRI    Trilby Drummer, MD   06/18/2013 2:58 PM    XR Chest AP Portable [[id number]] Collected:06/18/13 1340    Order Status:Completed  Updated:06/18/13 1346    Narrative:    CLINICAL HISTORY: Dizziness    FINDINGS: Portable upright AP view of the chest obtained. Comparison:  09/01/2012    No infiltrate. Pulmonary vascularity is normal. No pleural effusion  identified. Cardiac silhouette and mediastinal contours are within  normal.      Impression:     No acute disease. No significant change.    Kennyth Lose, MD   06/18/2013 1:42 PM          Scribe and MD Attestations     I, Bonner Puna, MD, personally performed the services documented. Gerarda Gunther is scribing for me on [facility]. I reviewed and confirm the accuracy of the information in this medical record.    I, Gerarda Gunther, am serving as a  Neurosurgeon to document services  personally performed by Bonner Puna, MD, based on the provider's statements to me.     Rendering Provider: Bonner Puna, MD    Monitors, EKG, Critical Care, and Splints     EKG (interpreted by ED physician): Normal sinus rhythm, Rate at 73bpm, Normal EKG.  Cardiac Monitor (interpreted by ED physician): Normal sinus rhythm, Rate in the 70s, Normal intervals, No ectopy.    MDM and Clinical Notes     He feels much better. Ambulating in the ER without difficulty. He feels this is largely due to stress, lack of sleep, and dehydration. He feels comfortable going home with close follow up, and return precautions given.   Of note, pt had somewhat similar symptoms 9 mo ago that was worked up as a TIA w/ MRI of his brain, echo, and carotid dopplers. He reports all were normal, and he and his wife felt his symptoms at that time were due to anxiety as well. He has a follow up appointment tomorrow with his doctor.     Diagnosis and Disposition     Clinical Impression  1. Lightheadedness        Disposition  ED Disposition     Discharge Marilynn Latino discharge to home/self care.    Condition at disposition: Stable            Prescriptions     New Prescriptions    No medications on file               Lorenza Burton Self, MD  06/19/13 1053

## 2013-06-18 NOTE — Discharge Instructions (Signed)
Dear Mr. Andrew Boyle:    I appreciate your choosing the [facility] for your healthcare needs, and hope your visit today was EXCELLENT.    Instructions:  Please follow-up with Dr. Donnamarie Rossetti as soon as possible.    Return to the Emergency Department for any worsening symptoms or concerns.    Below is some information that our patients often find helpful.    We wish you good health and please do not hesitate to contact us if we can ever be of any assistance.    Sincerely,  Thad Ranger, Tiffany Kocher, MD  [facility]    ________________________________________________________________    If you do not continue to improve or your condition worsens, please contact your doctor or return immediately to the Emergency Department.    Thank you for choosing [facility] for your emergency care needs.  We strive to provide EXCELLENT care to you and your family.      DOCTOR REFERRALS  Call [phone] if you need any further referrals and we can help you find a primary care doctor or specialist.  Also, available online at:  [email or web address]    YOUR CONTACT INFORMATION  Before leaving please check with registration to make sure we have an up-to-date contact number.  You can call registration at [phone] to update your information.  For questions about your hospital bill, please call [phone].  For questions about your Emergency Dept Physician bill please call [phone].      FREE HEALTH SERVICES  If you need help with health or social services, please call 2-1-1 for a free referral to resources in your area.  2-1-1 is a free service connecting people with information on health insurance, free clinics, pregnancy, mental health, dental care, food assistance, housing, and substance abuse counseling.  Also, available online at:  http://[email or web address].org    MEDICAL RECORDS AND TESTS  Certain laboratory test results do not come back  the same day, for example urine cultures.   We will contact you if other important findings are noted.  Radiology films are often reviewed again to ensure accuracy.  If there is any discrepancy, we will notify you.      Please call [phone] to pick up a complimentary CD of any radiology studies performed.  If you or your doctor would like to request a copy of your medical records, please call [phone].      ORTHOPEDIC INJURY   Please know that significant injuries can exist even when an initial x-ray is read as normal or negative.  This can occur because some fractures (broken bones) are not initially visible on x-rays.  For this reason, close outpatient follow-up with your primary care doctor or bone specialist (orthopedist) is required.    MEDICATIONS AND FOLLOWUP  Please be aware that some prescription medications can cause drowsiness.  Use caution when driving or operating machinery.    The examination and treatment you have received in our Emergency Department is provided on an emergency basis, and is not intended to be a substitute for your primary care physician.  It is important that your doctor checks you again and that you report any new or remaining problems at that time.      24 HOUR PHARMACIES  [facility] - [address] [place], [place] [address] (1.4 miles, 7 minutes)  [facility] - [address], [place], [place] [address] (6.5 miles, 13 minutes)  Handout with directions available  on request

## 2013-06-18 NOTE — ED Notes (Signed)
Bed:B24<BR> Expected date:<BR> Expected time:<BR> Means of arrival:<BR> Comments:<BR> medic

## 2013-06-18 NOTE — ED Notes (Signed)
Dizziness when looking up since 0800 this morning and feeling "out of it".  Denies pain.  Accucheck PTA is 129.

## 2013-06-19 LAB — ECG 12-LEAD
Atrial Rate: 73 {beats}/min
P Axis: 70 degrees
P-R Interval: 166 ms
Q-T Interval: 376 ms
QRS Duration: 76 ms
QTC Calculation (Bezet): 414 ms
R Axis: 78 degrees
T Axis: 72 degrees
Ventricular Rate: 73 {beats}/min

## 2014-03-07 ENCOUNTER — Other Ambulatory Visit (FREE_STANDING_LABORATORY_FACILITY): Payer: BC Managed Care – PPO

## 2014-03-07 ENCOUNTER — Ambulatory Visit: Payer: BC Managed Care – PPO | Admitting: Internal Medicine

## 2014-03-07 ENCOUNTER — Encounter: Payer: Self-pay | Admitting: Internal Medicine

## 2014-03-07 VITALS — BP 112/77 | HR 67 | Temp 98.3°F | Ht 66.14 in | Wt 178.0 lb

## 2014-03-07 DIAGNOSIS — G451 Carotid artery syndrome (hemispheric): Secondary | ICD-10-CM

## 2014-03-07 LAB — URINALYSIS
Bilirubin, UA: NEGATIVE
Blood, UA: NEGATIVE
Glucose, UA: NEGATIVE
Ketones UA: NEGATIVE
Leukocyte Esterase, UA: NEGATIVE
Nitrite, UA: NEGATIVE
Protein, UR: NEGATIVE
Specific Gravity UA: 1.013 (ref 1.001–1.035)
Urine pH: 7 (ref 5.0–8.0)
Urobilinogen, UA: 0.2 (ref 0.2–2.0)

## 2014-03-07 LAB — LIPID PANEL
Cholesterol / HDL Ratio: 4.4
Cholesterol: 163 mg/dL (ref 0–199)
HDL: 37 mg/dL — ABNORMAL LOW (ref >40)
LDL Calculated: 112 mg/dL — ABNORMAL HIGH (ref 0–99)
Triglycerides: 69 mg/dL (ref 34–149)
VLDL Calculated: 14 mg/dL (ref 10–40)

## 2014-03-07 LAB — CBC
Hematocrit: 46.6 % (ref 42.0–52.0)
Hgb: 15.7 g/dL (ref 13.0–17.0)
MCH: 29.4 pg (ref 28.0–32.0)
MCHC: 33.7 g/dL (ref 32.0–36.0)
MCV: 87.3 fL (ref 80.0–100.0)
MPV: 12 fL (ref 9.4–12.3)
Nucleated RBC: 0 /100 WBC (ref 0–1)
Platelets: 205 10*3/uL (ref 140–400)
RBC: 5.34 10*6/uL (ref 4.70–6.00)
RDW: 14 % (ref 12–15)
WBC: 6.48 10*3/uL (ref 3.50–10.80)

## 2014-03-07 LAB — GFR: EGFR: >60

## 2014-03-07 LAB — COMPREHENSIVE METABOLIC PANEL
ALT: 23 U/L (ref 0–55)
AST (SGOT): 19 U/L (ref 5–34)
Albumin/Globulin Ratio: 1.4 (ref 0.9–2.2)
Albumin: 4.4 g/dL (ref 3.5–5.0)
Alkaline Phosphatase: 75 U/L (ref 38–106)
BUN: 21 mg/dL (ref 9.0–28.0)
Bilirubin, Total: 0.5 mg/dL (ref 0.1–1.2)
CO2: 27 mEq/L (ref 21–30)
Calcium: 9.8 mg/dL (ref 8.5–10.5)
Chloride: 107 mEq/L (ref 100–111)
Creatinine: 1.2 mg/dL (ref 0.5–1.5)
Globulin: 3.2 g/dL (ref 2.0–3.7)
Glucose: 89 mg/dL (ref 70–100)
Potassium: 4.8 mEq/L (ref 3.5–5.3)
Protein, Total: 7.6 g/dL (ref 6.0–8.3)
Sodium: 141 mEq/L (ref 135–146)

## 2014-03-07 LAB — CORTISOL: Cortisol: 6.3 ug/dL

## 2014-03-07 LAB — SEDIMENTATION RATE: Sed Rate: 5 mm/Hr (ref 0–10)

## 2014-03-07 LAB — HEMOLYSIS INDEX: Hemolysis Index: 11 (ref 0–18)

## 2014-03-07 LAB — PSA: Prostate Specific Antigen, Total: 2.212 ng/mL (ref 0.000–4.000)

## 2014-03-07 NOTE — Progress Notes (Signed)
Chief Complaint   Patient presents with   . Annual Exam       History of Present Illness    Andrew Boyle is a 53 y.o. male   The patient presents for a preventive health exam. He has presented the following  concerns , episodes of transient ischemic attack/seizure... He is exercising 0 times a week. Andrew Boyle is eating a low fat diet and  is a nonsmoker and drinks an average of   0 drinks per week per. Overall stress is a moderate problem in his life. Andrew Boyle is well aware of the major preventive health practices and screening preventive health options and expresses commitment to these in the future.    Pt was feslt to have a TIA ?[name] He felt he was loosing balance and had trouble speaking and rt arm was shaking. He was drooling.1 1/2 year ago sx lasted only a minute or two. He has two recurrences one in Jan aond one last week His rt arm was shaking and unable to speak. He has a premonition of 30 sec. Then the sx lasted about 1 minute.  He has had a fairly prolonged dazed kind of feeling.  After these episodes.  In each case for a few hours.  He feels somewhat "out of it, but is able to walk function and communicate adequately  Patient Active Problem List    Diagnosis Date Noted   . Displacement of lumbar intervertebral disc without myelopathy 03/21/2013   . Infected sebaceous cyst 11/28/2012   . TIA (transient ischemic attack) 09/01/2012   . Dehydration 09/01/2012   . Anxiety 09/01/2012     Past Medical History   Diagnosis Date   . Back pain      No past surgical history on file.    Health Maintenance    There is no immunization history on file for this patient.  Health Maintenance   Topic Date Due   . COLONOSCOPY TEN YEARS  03[birth year]1959   . DEPRESSION SCREENING  05/19/1971   . BMI SCREENING AND FOLLOW-UP  05/18/1977   . INFLUENZA VACCINE  02/05/2014   :  Current Outpatient Prescriptions   Medication Sig Dispense Refill   . aspirin EC 81 MG EC tablet Take 81 mg by mouth daily.       No  current facility-administered medications for this visit.     Allergies   Allergen Reactions   . Ciprofloxacin Hives     History     Social History   . Marital Status: Married     Spouse Name: N/A     Number of Children: N/A   . Years of Education: N/A     Occupational History   . Not on file.     Social History Main Topics   . Smoking status: Never Smoker    . Smokeless tobacco: Not on file   . Alcohol Use: 0.0 oz/week     3-4 Glasses of wine per week   . Drug Use: No   . Sexual Activity:     Partners: Female     Other Topics Concern   . Not on file     Social History Narrative   . No narrative on file     Family History   Problem Relation Age of Onset   . Heart disease Mother      MI   . Parkinsonism Father        Review of Systems  CONST: No weight  change, no fevers/chills or sweats, fatigue, muscle aches, appetite loss  EYES: No blurry vision, double vision, loss of peripheral vision  EARS: No hearing loss, tinnitus, pain or discharge.   NOSE: No congestion, runny nose or bloody nose  MOUTH: No sore throat, bleeding gums, difficulty swallowing  NECK: No swollen glands, stiffness or pain  CV: No CP, palpitations, leg swelling, no changes in exercise tolerance, no PND, no orthopnea  RESP: No SOB, cough, wheeze, asthma  GI: No n/v, diarrhea, constipation, abd pain, heartburn, blood in stool, early satiety  MALE GU: No dysuria, frequency, hematuria, nocturia, testicular changes, penile discharge, erectile dysfunction  MSK:  No joint or muscle pain, swelling or weakness  SKIN:   No rashes lumps, sores or concerning moles. No changes in hair or nails.  ENDO:  No heat/cold intolerance, polyuria, polydipsia, polyphagia  HEME:  No bruising/bleeding, swollen glands  NEURO:  No HA, LH, dizziness, LOC, numbness/tingling, weakness  PSYCH:  No depressed mood, anhedonia, anxiety    Physical Exam:  There were no vitals taken for this visit.  Wt Readings from Last 3 Encounters:   03/20/13 73.936 kg (163 lb)   11/28/12 70.761 kg  (156 lb)   09/14/12 73.029 kg (161 lb)     GEN: WD WN Obese Cauc AA Asian M / F alert and appropriate in NAD  HENT: NCAT, TMs normal bilaterally, no nasal congestion, OP normal w no exudate or erythema  EYES: PERRL, EOMI, no pallor or scleral icterus, normal conjunctiva  NECK: supple, no thyromegaly or nodules appreciated  LYMPH: no cervical or supraclavicular LAD appreciated  CV: RRR, no m/r/g.  No LE edema.  2+ radial pulses present and equal.  RESP: CTAB, normal effort, no rhonchi or rales  GI: soft, nontender/ nondistended, NABS, no rebound or guarding, no evident HSM  GU: NEMG no masses or hernia evident or palpable  SKIN: warm, dry.  no visible rashes or concerning moles  MSK: Normal bulk and tone, normal strength 5/5 and sensation UE / LE  NEURO: No CN deficits noted (II-XII) PERRLA, EOMI, face symmetric, hearing intact to voice, palate raise, shoulder shrug and neck flexion intact, tongue midline. MAE.  PSYCH: normal mood and affect    Assessment/Plan:    1. Hemispheric carotid artery syndrome  At the present time for lack of a better term.  These will be characterizes a transient ischemic attack.  He has poor control and use of his right arm, inability to speak and a prolonged dazed.  Afterwards.  Overall, these seem more suggestive of seizure than actually transient ischemic attack and they were characterizes transient ischemic attack.  2.  The neurologist who is caring for him previously.  The patient will be referred back to neurology to reconsider the possibility of additional EEG testing, perhaps with sleep deprivation and hypo-glycemia.  It would appear to be more likely that these are simple focal seizures.  No medication be prescribed the present time until diagnosis can be more firmly established.  The patient will have lab work done including basic chemistries and at patient request.  A cortisol level will be done.  - CBC without differential; Future  - Comprehensive metabolic panel; Future  -  Lipid panel; Future  - PSA; Future  - Urinalysis; Future  - Sedimentation rate (ESR); Future  - ECG 12 lead; Future  - Cortisol    The patient was counseled about lifestyle measures that may reduce risk of vascular disease and cancer.  Dolores Hoose, M.D.

## 2014-03-15 NOTE — Addendum Note (Signed)
Addended by: Marsa Aris on: 03/15/2014 05:32 PM     Modules accepted: Orders

## 2014-03-19 ENCOUNTER — Ambulatory Visit (INDEPENDENT_AMBULATORY_CARE_PROVIDER_SITE_OTHER): Payer: BC Managed Care – PPO | Admitting: Neurology

## 2014-03-19 VITALS — BP 116/76 | HR 69 | Ht 66.0 in | Wt 158.0 lb

## 2014-03-19 DIAGNOSIS — F410 Panic disorder [episodic paroxysmal anxiety] without agoraphobia: Secondary | ICD-10-CM

## 2014-03-19 DIAGNOSIS — R251 Tremor, unspecified: Secondary | ICD-10-CM

## 2014-03-19 DIAGNOSIS — G4761 Periodic limb movement disorder: Secondary | ICD-10-CM

## 2014-03-19 MED ORDER — SERTRALINE HCL 25 MG PO TABS
25.0000 mg | ORAL_TABLET | Freq: Every day | ORAL | Status: DC
Start: 2014-03-19 — End: 2022-06-03

## 2014-03-19 MED ORDER — ALPRAZOLAM 1 MG PO TABS
1.0000 mg | ORAL_TABLET | Freq: Every evening | ORAL | Status: DC | PRN
Start: 2014-03-19 — End: 2018-01-17

## 2014-03-19 NOTE — Progress Notes (Signed)
Subjective:       Patient ID: Andrew Boyle is a 53 y.o. male.    HPI    A 53 year old male who came for evaluation of an episode of questionable  transient ischemic attack versus seizure on September 01, 2012.  The patient  had an episode of ? TIA.  The episode was described he was at home and was  opening the refrigerator.  All of a sudden, he became aphasic, and he was  not talking.  He had a loud scream, witnessed by his wife.  He was mute for  about 10 to 20 seconds.  There was no shaking.  There was tonic stiffening  of the whole body.  There was no fall. No tongue bite.  No wet pants.      After the episode, he was back to baseline, and he did not lose awareness or consciousness during the episode.   Pertinent negative h/o:  There was no diplopia, no dysphasia, no slurring of speech, no facial droop, no focal  weakness, and no numbness noted after the episode.  There was no postictal  confusion, lethargy, or sleep.  He was back to baseline.  He was taken to  the emergency room and was admitted.  He was investigated, and his MRI MRA  was found to be negative.  An EEG was not done.  He was asked to discharge.   He was discharged and followed up on outpatient basis for EEG.     I personally reviewed the MRI MRA of the brain both report and the images.   There is no acute pathology seen.  I also reviewed the EEG done last week.   There were no epileptiform changes suggestive of any seizure diathesis.     He denies any family history of seizure.  He denies any childhood history  of seizures.  He denies any head trauma in the past, requiring  hospitalization or brain surgery in the past.    See detailed assessment / plan for the new developments since last visit and the respective new recommendations.         The following portions of the patient's history were reviewed and updated as appropriate: allergies, current medications, past family history, past medical history, past social history, past surgical  history and problem list.    Review of Systems    All systems were reviewed and were negative except as described in the HPI.        Objective:    Physical Exam    Filed Vitals:    03/19/14 1001   BP: 116/76   Pulse: 69       General: The patient was well developed and well nourished.  No acute distress.  Neck:  no carotid bruits  CVS: RRR, no murmurs, rubs,or gallops  Lungs: clear to auscultation bilaterally  Extremities: no pedal edema, extremities normal in color    Mental Status: The pateint was awake, alert and oriented X4.  Normal affect.  Recent and remote memory appeared to be normal.   Attention span and concentration appear normal.  Fluent without aphasia.   Registers 3/3 and recalls 3/3 after 5 minutes.  Cranial nerves: Pupils are equal, round and reactive to light.  Visual fields full.    EOM intact.  No ptosis.  No diplopia.  No nystagmus.  Facial sensation intact.   Face symmetric.  No dysarthria.  Hearing grossly intact.  Shoulder shrug symmetric.  Tongue protrudes midline.  Uvula  is midline.  Fundus: clear, no papilledema, no hemorrhages seen.  Motor: Muscle tone normal. No atrophy.  No fasiculations. No pronator drift.  Strength  R / L    R / L  Deltoid  5 / 5  Hip Flexion 5 / 5  Triceps  5 / 5   Hip extension 5 / 5  Biceps  5 / 5   Knee flexion 5 / 5  Wrist ext 5 / 5  Knee ext 5 / 5  Wrist flexion 5 / 5  Dorsiflexion 5 / 5  FF   5 / 5  Plantar flexion 5 / 5  IO   5 / 5  Foot inverters 5 / 5  Sensory:   Light touch intact.  Pinprick intact.  Temperature intact.  Vibration intact.  Proprioception intact.  Reflexes:  R / L     R / L  Biceps  2 / 2  Knees  2 / 2  Triceps 2 / 2  Ankles  2 / 2  Brachioradialis2 / 2  Babinksi Down / down  Negative palmomental, glabellar, snout, rooting, or grasp.  Coordination: FTN and HKS intact, no truncal ataxia. RAMs intact. No tremors  Gait: Stable. Good stride length, good initiation.  Intact to tandem, heel, toe walk. Negative Romberg.          Assessment:    The  patient is a 53 year old male who presented for episodes of  questionable transient ischemic attack versus seizure on September 02, 2012.   Based on the history and nonfocal neuro exam and review of EEG and MRI of  the brain, MRA of the brain, there is no focal neurovascular abnormality  found. The patient is asymptomatic from one episode. This could be acute  stress reaction and is not calling for any kind of  neuro intervention or  treatment at this time.      Per last visit 09/2012: His cholesterol levels are LDL 100. Blood pressure 124/76 without any blood  pressure medications.  He is being asked to continue followup with his  primary care doctor for neurology check up.  No aspirin. No cholesterol or  any secondary stroke prevention is recommended at that  time.      .    Since last visit: 3 attacks likely panic attacks.    Recommend:  r EEG  zoloft 25 mg PO daily  Xanax prn           Plan:       As above.     Additional notes and data scanned including patient questionnaire which may contain pertinent information to visit. Patient can follow up sooner if needed. In the meantime, patient will contact the office with any questions or concerns.     Total time spent 40 mins. 30 minutes  was spent counseling the patient on one or more of the following: for the above plan co ordination. Relaxation techniques. Need for zoloft and xanax.   Disease state - discussion about the medical condition, diagnostic and treatment options   Medications - indications and side effects   Lifestyle issues as appropriate.     Rosalio Macadamia, MD - [facility]  NEUROLOGY  Available on XTEND paging  Board Certified in Neurology by ABPN  Board Certified Clinical Neurophysiology by ABPN           Procedures

## 2014-04-09 ENCOUNTER — Ambulatory Visit (INDEPENDENT_AMBULATORY_CARE_PROVIDER_SITE_OTHER): Payer: BC Managed Care – PPO | Admitting: Neurology

## 2014-04-09 DIAGNOSIS — F410 Panic disorder [episodic paroxysmal anxiety] without agoraphobia: Secondary | ICD-10-CM

## 2014-04-09 NOTE — Progress Notes (Signed)
Tech Report   EEG No: [id number]  Hrs of sleep: 6.5hrs  Dominance: right   Last Meal: AM  Caffeine:  No  Previous EEG: No  Andrew Boyle has a current medication list which includes the following prescription(s): alprazolam, aspirin ec, and sertraline.   History: TIA  Description: PS, Awake, Drowsy and Alert  Activity Noted:   9-10  Hz Background activity, LVF anterior, Driving with PS, , GS with Drowsiness    Technologist: Letta Pate Ryun Velez

## 2014-05-05 NOTE — Procedures (Signed)
Indication / History: The patient has h/o panic attacks    This electroencephalogram was recorded using both referential and differential montages. Using a digital machine the 18 channel International 10-20 System of electrode placement was used.    Medications listed include none      Background: The EEG was recorded with the patient awake, drowsy and asleep. The waking background activity in the occipital lobe consists of fairly well developed medium voltage 10-11 hz. activity that attenuates with eye opening. Some eye movement artifact and low voltage fast activity is seen frontally.   Drowsiness heralded by disorganization and slowing of the background. Stage II sleep was obtained with symmetric spindle activity and vertex waves.     Abnormal activity : No epileptiform discharges, focal slowing, spike waves or clinical events noted.  Hemispheric asymmetry : none    Photic stimulation and hyperventilation produced no abnormality.    Limited one lead EKG recording showed NSR @  90 /m, and no arrhythmias seen.    IMPRESSION: Normal study: This routine EEG recorded during wakefulness and drowsiness was within normal limits for age. There is no evidence of epileptiform activity or slowing. A normal EEG can be seen in 30-50% of patients with epilepsy. Clinical correlation with neurologist is recommended.       Rosalio Macadamia, MD -   NEUROLOGY  Available on XTEND paging  Board Certified in Neurology by ABPN  Board Certified Clinical Neurophysiology by ABPN

## 2014-06-18 ENCOUNTER — Ambulatory Visit (INDEPENDENT_AMBULATORY_CARE_PROVIDER_SITE_OTHER): Payer: BC Managed Care – PPO | Admitting: Neurology

## 2014-06-19 ENCOUNTER — Ambulatory Visit (INDEPENDENT_AMBULATORY_CARE_PROVIDER_SITE_OTHER): Payer: BC Managed Care – PPO | Admitting: Neurology

## 2014-08-02 ENCOUNTER — Ambulatory Visit (INDEPENDENT_AMBULATORY_CARE_PROVIDER_SITE_OTHER): Payer: BC Managed Care – PPO | Admitting: Neurology

## 2014-09-04 ENCOUNTER — Ambulatory Visit (INDEPENDENT_AMBULATORY_CARE_PROVIDER_SITE_OTHER): Payer: BC Managed Care – PPO | Admitting: Neurology

## 2014-09-20 ENCOUNTER — Ambulatory Visit (INDEPENDENT_AMBULATORY_CARE_PROVIDER_SITE_OTHER): Payer: BC Managed Care – PPO | Admitting: Neurology

## 2014-09-20 VITALS — BP 110/73 | HR 66

## 2014-09-20 DIAGNOSIS — R4189 Other symptoms and signs involving cognitive functions and awareness: Secondary | ICD-10-CM

## 2014-09-20 NOTE — Progress Notes (Signed)
Is the patient taking current medications?Yes  Has there been any changes to current medication list?no change    Document any barriers/adverse reactions to current medicationsnone  CURRENT BARRIERS TO TAKING MEDICATION:no    Has the patient sought any care outside of the [facility]?No

## 2014-09-20 NOTE — Progress Notes (Signed)
Subjective:       Patient ID: Andrew Boyle is a 54 y.o. male.    HPI    A 54 year old male who came for evaluation of an episode of questionable  transient ischemic attack versus seizure on September 01, 2012.  The patient  had an episode of ? TIA.  The episode was described he was at home and was  opening the refrigerator.  All of a sudden, he became aphasic, and he was  not talking.  He had a loud scream, witnessed by his wife.  He was mute for  about 10 to 20 seconds.  There was no shaking.  There was tonic stiffening  of the whole body.  There was no fall. No tongue bite.  No wet pants.      After the episode, he was back to baseline, and he did not lose awareness or consciousness during the episode.   Pertinent negative h/o:  There was no diplopia, no dysphasia, no slurring of speech, no facial droop, no focal  weakness, and no numbness noted after the episode.  There was no postictal  confusion, lethargy, or sleep.  He was back to baseline.  He was taken to  the emergency room and was admitted.  He was investigated, and his MRI MRA  was found to be negative.  An EEG was not done.  He was asked to discharge.   He was discharged and followed up on outpatient basis for EEG.     I personally reviewed the MRI MRA of the brain both report and the images.   There is no acute pathology seen.  I also reviewed the EEG done last week.   There were no epileptiform changes suggestive of any seizure diathesis.     He denies any family history of seizure.  He denies any childhood history  of seizures.  He denies any head trauma in the past, requiring  hospitalization or brain surgery in the past.    See detailed assessment / plan for the new developments since last visit and the respective new recommendations.         The following portions of the patient's history were reviewed and updated as appropriate: allergies, current medications, past family history, past medical history, past social history, past surgical  history and problem list.    Review of Systems    All systems were reviewed and were negative except as described in the HPI.        Objective:    Physical Exam    Filed Vitals:    09/20/14 1538   BP: 110/73   Pulse: 66       General: The patient was well developed and well nourished.  No acute distress.  Neck:  no carotid bruits  CVS: RRR, no murmurs, rubs,or gallops  Lungs: clear to auscultation bilaterally  Extremities: no pedal edema, extremities normal in color    Mental Status: The pateint was awake, alert and oriented X4.  Normal affect.  Recent and remote memory appeared to be normal.   Attention span and concentration appear normal.  Fluent without aphasia.   Registers 3/3 and recalls 3/3 after 5 minutes.  Cranial nerves: Pupils are equal, round and reactive to light.  Visual fields full.    EOM intact.  No ptosis.  No diplopia.  No nystagmus.  Facial sensation intact.   Face symmetric.  No dysarthria.  Hearing grossly intact.  Shoulder shrug symmetric.  Tongue protrudes midline.  Uvula  is midline.  Fundus: clear, no papilledema, no hemorrhages seen.  Motor: Muscle tone normal. No atrophy.  No fasiculations. No pronator drift.  Strength  R / L    R / L  Deltoid  5 / 5  Hip Flexion 5 / 5  Triceps  5 / 5   Hip extension 5 / 5  Biceps  5 / 5   Knee flexion 5 / 5  Wrist ext 5 / 5  Knee ext 5 / 5  Wrist flexion 5 / 5  Dorsiflexion 5 / 5  FF   5 / 5  Plantar flexion 5 / 5  IO   5 / 5  Foot inverters 5 / 5  Sensory:   Light touch intact.  Pinprick intact.  Temperature intact.  Vibration intact.  Proprioception intact.  Reflexes:  R / L     R / L  Biceps  2 / 2  Knees  2 / 2  Triceps 2 / 2  Ankles  2 / 2  Brachioradialis2 / 2  Babinksi Down / down  Negative palmomental, glabellar, snout, rooting, or grasp.  Coordination: FTN and HKS intact, no truncal ataxia. RAMs intact. No tremors  Gait: Stable. Good stride length, good initiation.  Intact to tandem, heel, toe walk. Negative Romberg.          Assessment:    The  patient is a 54 year old male who presented for episodes of  questionable transient ischemic attack versus seizure on September 02, 2012.   Based on the history and nonfocal neuro exam and review of EEG and MRI of  the brain, MRA of the brain, there is no focal neurovascular abnormality  found. The patient is asymptomatic from one episode. This could be acute  stress reaction and is not calling for any kind of  neuro intervention or  treatment at this time.      Per visit 09/2012: His cholesterol levels are LDL 100. Blood pressure 124/76 without any blood  pressure medications.  He is being asked to continue followup with his  primary care doctor for neurology check up.  No aspirin. No cholesterol or  any secondary stroke prevention is recommended at that  time.     Per last visit 03/2014: pt had 3 panic attacks so recommended zoloft, xanax and r EEG    Since last visit: R EEG 04/2014: normal. Pt had 2 similar episodes last one 1 week back. No LOC.not taken xanax. Pt and wife very worried re: these spells. Not taken zoloft. Will do as below.      Recommend:  Xanax prn  neuropsych evaluation  Labs: B12, MAGNESIUM, B1, B6 Levels  F/u in 2 weeks             Plan:       As above.     Additional notes and data scanned including patient questionnaire which may contain pertinent information to visit. Patient can follow up sooner if needed. In the meantime, patient will contact the office with any questions or concerns.     Total time spent 40 mins. 30 minutes  was spent counseling the patient on one or more of the following: for the above plan co ordination. Relaxation techniques. Need for zoloft and xanax.   Disease state - discussion about the medical condition, diagnostic and treatment options   Medications - indications and side effects   Lifestyle issues as appropriate.     Rosalio Macadamia, MD - [place]  NEUROLOGY  Available on XTEND paging  Board Certified in Neurology by ABPN  Board Certified Clinical Neurophysiology  by ABPN           Procedures

## 2015-10-16 ENCOUNTER — Encounter (INDEPENDENT_AMBULATORY_CARE_PROVIDER_SITE_OTHER): Payer: BC Managed Care – PPO | Admitting: Internal Medicine

## 2015-11-04 ENCOUNTER — Encounter (INDEPENDENT_AMBULATORY_CARE_PROVIDER_SITE_OTHER): Payer: Self-pay | Admitting: Internal Medicine

## 2015-11-04 ENCOUNTER — Ambulatory Visit (INDEPENDENT_AMBULATORY_CARE_PROVIDER_SITE_OTHER): Payer: Commercial Managed Care - POS | Admitting: Internal Medicine

## 2015-11-04 VITALS — BP 112/74 | HR 61 | Temp 97.8°F | Ht 66.75 in | Wt 150.0 lb

## 2015-11-04 DIAGNOSIS — N529 Male erectile dysfunction, unspecified: Secondary | ICD-10-CM

## 2015-11-04 DIAGNOSIS — Z0000 Encounter for general adult medical examination without abnormal findings: Secondary | ICD-10-CM

## 2015-11-04 DIAGNOSIS — F419 Anxiety disorder, unspecified: Secondary | ICD-10-CM

## 2015-11-04 NOTE — Progress Notes (Signed)
Have you seen any specialists/other providers since your last visit with us?    No      Limb alert protocol reviewed?      Yes

## 2015-11-04 NOTE — Progress Notes (Signed)
Chief Complaint   Patient presents with   . Annual Exam       History of Present Illness    Andrew Boyle is a 55 y.o. male   The patient presents for a preventive health exam. He has presented the following  concerns pseudosizures.[name] He is exercising 0 times a week. Leam is eating a low sugar, low salt and low fat diet and  is a nonsmoker and drinks an average of   3 drinks per week per. Overall stress is a moderate to high problem in his life. Andrew Boyle is well aware of the major preventive health practices and screening preventive health options and expresses commitment to these in the future.    Episodes under stress like having a seizure. He is unable to speak, he has no loss of consciousness. He says Neuro has found no problems. He has eisoes q 2 months in the winter. He shakes in the hand and jaw    Patient Active Problem List    Diagnosis Date Noted   . Panic attacks 03/19/2014   . Periodic limb movement disorder 03/19/2014   . Displacement of lumbar intervertebral disc without myelopathy 03/21/2013   . Infected sebaceous cyst 11/28/2012   . TIA (transient ischemic attack) 09/01/2012   . Dehydration 09/01/2012   . Anxiety 09/01/2012     Past Medical History   Diagnosis Date   . Back pain      No past surgical history on file.    Health Maintenance    There is no immunization history on file for this patient.  Health Maintenance   Topic Date Due   . COLONOSCOPY TEN YEARS  [birth year]   . DEPRESSION SCREENING  03/08/2015   . INFLUENZA VACCINE  02/06/2016   :  Current Outpatient Prescriptions   Medication Sig Dispense Refill   . ALPRAZolam (XANAX) 1 MG tablet Take 1 tablet (1 mg total) by mouth nightly as needed for Sleep. 30 tablet 0   . aspirin EC 81 MG EC tablet Take 81 mg by mouth daily.     . sertraline (ZOLOFT) 25 MG tablet Take 1 tablet (25 mg total) by mouth daily. 30 tablet 3     No current facility-administered medications for this visit.     Allergies   Allergen Reactions    . Ciprofloxacin Hives     Social History     Social History   . Marital Status: Married     Spouse Name: N/A   . Number of Children: N/A   . Years of Education: N/A     Occupational History   . Not on file.     Social History Main Topics   . Smoking status: Never Smoker    . Smokeless tobacco: Never Used   . Alcohol Use: 0.0 oz/week     3-4 Glasses of wine per week   . Drug Use: No   . Sexual Activity:     Partners: Female     Other Topics Concern   . Not on file     Social History Narrative     Family History   Problem Relation Age of Onset   . Heart disease Mother      MI   . Parkinsonism Father        Review of Systems  CONST: No weight change, no fevers/chills or sweats, fatigue, muscle aches, appetite loss  EYES: No blurry vision, double vision, loss of peripheral vision  EARS:  No hearing loss, tinnitus, pain or discharge.   NOSE: No congestion, runny nose or bloody nose  MOUTH: No sore throat, bleeding gums, difficulty swallowing  NECK: No swollen glands, stiffness or pain  CV: No CP, palpitations, leg swelling, no changes in exercise tolerance, no PND, no orthopnea  RESP: No SOB, cough, wheeze, asthma  GI: No n/v, diarrhea, constipation, abd pain, heartburn, blood in stool, early satiety  MALE GU: No dysuria, frequency, hematuria, nocturia, testicular changes, penile discharge, erectile dysfunction  nocturia,  MSK:  No joint or muscle pain, swelling or weakness  SKIN:   No rashes lumps, sores or concerning moles. No changes in hair or nails.  ENDO:  No heat/cold intolerance, polyuria, polydipsia, polyphagia  HEME:  No bruising/bleeding, swollen glands  NEURO:  No HA, LH, dizziness, LOC, numbness/tingling, weakness  PSYCH:  No depressed mood, anhedonia, anxiety    Physical Exam:  There were no vitals taken for this visit.  Wt Readings from Last 3 Encounters:   03/19/14 71.668 kg (158 lb)   03/07/14 80.74 kg (178 lb)   03/20/13 73.936 kg (163 lb)     GEN: WD WN  M  alert and appropriate in NAD  HENT: NCAT,  TMs normal bilaterally, no nasal congestion, OP normal w no exudate or erythema  EYES: PERRL, EOMI, no pallor or scleral icterus, normal conjunctiva  NECK: supple, no thyromegaly or nodules appreciated  LYMPH: no cervical or supraclavicular LAD appreciated  CV: RRR, no m/r/g.  No LE edema.  2+ radial pulses present and equal.  RESP: CTAB, normal effort, no rhonchi or rales  GI: soft, nontender/ nondistended, NABS, no rebound or guarding, no evident HSM  GU: NEMG no masses or hernia evident or palpable  SKIN: warm, dry.  no visible rashes or concerning moles  MSK: Normal bulk and tone, normal strength 5/5 and sensation UE / LE  NEURO: No CN deficits noted (II-XII) PERRLA, EOMI, face symmetric, hearing intact to voice, palate raise, shoulder shrug and neck flexion intact, tongue midline. MAE.  PSYCH: normal mood and affect    Assessment/Plan:    1. Erectile dysfunction, unspecified erectile dysfunction type  Will check labs.   - CBC and differential; Future  - Lipid panel; Future  - Urinalysis; Future  - Comprehensive metabolic panel; Future  - PSA; Future  - Magnesium; Future  - Testosterone; Future    2. Anxiety  His episodes ore felt to be anxiety by neurology will obseve fornow.   - CBC and differential; Future  - Lipid panel; Future  - Urinalysis; Future  - Comprehensive metabolic panel; Future  - PSA; Future  - Magnesium; Future  - Testosterone; Future                     No Follow-up on file.    Carlota Raspberry, M.D.

## 2015-11-05 ENCOUNTER — Other Ambulatory Visit (FREE_STANDING_LABORATORY_FACILITY): Payer: Commercial Managed Care - POS

## 2015-11-05 DIAGNOSIS — F419 Anxiety disorder, unspecified: Secondary | ICD-10-CM

## 2015-11-05 DIAGNOSIS — N529 Male erectile dysfunction, unspecified: Secondary | ICD-10-CM

## 2015-11-05 LAB — CBC AND DIFFERENTIAL
Basophils Absolute Automated: 0.01 10*3/uL (ref 0.00–0.20)
Basophils Automated: 0 %
Eosinophils Absolute Automated: 0.34 10*3/uL (ref 0.00–0.70)
Eosinophils Automated: 5 %
Hematocrit: 46.5 % (ref 42.0–52.0)
Hgb: 15 g/dL (ref 13.0–17.0)
Immature Granulocytes Absolute: 0.02 10*3/uL
Immature Granulocytes: 0 %
Lymphocytes Absolute Automated: 2.18 10*3/uL (ref 0.50–4.40)
Lymphocytes Automated: 34 %
MCH: 29.1 pg (ref 28.0–32.0)
MCHC: 32.3 g/dL (ref 32.0–36.0)
MCV: 90.3 fL (ref 80.0–100.0)
MPV: 11.4 fL (ref 9.4–12.3)
Monocytes Absolute Automated: 0.45 10*3/uL (ref 0.00–1.20)
Monocytes: 7 %
Neutrophils Absolute: 3.51 10*3/uL (ref 1.80–8.10)
Neutrophils: 54 %
Nucleated RBC: 0 /100 WBC (ref 0–1)
Platelets: 209 10*3/uL (ref 140–400)
RBC: 5.15 10*6/uL (ref 4.70–6.00)
RDW: 15 % (ref 12–15)
WBC: 6.51 10*3/uL (ref 3.50–10.80)

## 2015-11-05 LAB — URINALYSIS
Bilirubin, UA: NEGATIVE
Blood, UA: NEGATIVE
Glucose, UA: NEGATIVE
Ketones UA: NEGATIVE
Leukocyte Esterase, UA: NEGATIVE
Nitrite, UA: NEGATIVE
Protein, UR: NEGATIVE
Specific Gravity UA: 1.025 (ref 1.001–1.035)
Urine pH: 6 (ref 5.0–8.0)
Urobilinogen, UA: 0.2 (ref 0.2–2.0)

## 2015-11-05 LAB — COMPREHENSIVE METABOLIC PANEL
ALT: 15 U/L (ref 0–55)
AST (SGOT): 17 U/L (ref 5–34)
Albumin/Globulin Ratio: 1.3 (ref 0.9–2.2)
Albumin: 4 g/dL (ref 3.5–5.0)
Alkaline Phosphatase: 76 U/L (ref 38–106)
BUN: 23 mg/dL (ref 9.0–28.0)
Bilirubin, Total: 0.7 mg/dL (ref 0.1–1.2)
CO2: 28 mEq/L (ref 21–30)
Calcium: 9.1 mg/dL (ref 8.5–10.5)
Chloride: 106 mEq/L (ref 100–111)
Creatinine: 1.3 mg/dL (ref 0.5–1.5)
Globulin: 3 g/dL (ref 2.0–3.7)
Glucose: 91 mg/dL (ref 70–100)
Potassium: 4.6 mEq/L (ref 3.5–5.3)
Protein, Total: 7 g/dL (ref 6.0–8.3)
Sodium: 140 mEq/L (ref 135–146)

## 2015-11-05 LAB — GFR: EGFR: 57

## 2015-11-05 LAB — LIPID PANEL
Cholesterol / HDL Ratio: 4.3
Cholesterol: 165 mg/dL (ref 0–199)
HDL: 38 mg/dL — ABNORMAL LOW (ref 40–9999)
LDL Calculated: 113 mg/dL — ABNORMAL HIGH (ref 0–99)
Triglycerides: 68 mg/dL (ref 34–149)
VLDL Calculated: 14 mg/dL (ref 10–40)

## 2015-11-05 LAB — HEMOLYSIS INDEX: Hemolysis Index: 9 (ref 0–18)

## 2015-11-05 LAB — MAGNESIUM: Magnesium: 2.3 mg/dL (ref 1.6–2.6)

## 2015-11-05 LAB — TESTOSTERONE: Testosterone: 636 ng/dL (ref 241–827)

## 2015-11-05 LAB — PSA: Prostate Specific Antigen, Total: 2.227 ng/mL (ref 0.000–4.000)

## 2015-11-06 NOTE — Progress Notes (Signed)
Quick Note:    (CBC) The complete blood count is within normal limits. There is no evidence of anemia. White blood cell count and platelet count are within normal limits. (CMP) Liver and kidney function are within normal limits. Electrolytes and glucose are within acceptable limits. (Lipid panel) The results of the lipid panel are within acceptable limits. Specifically, the LDL ( bad cholesterol ), triglycerides, and HDL ( good cholesterol ) are within acceptable limits. The urinalysis is within normal limits. There is no evidence of blood or protein in the urine.    Overall your labs were normal. Keep up the good work. Eating a low fat and high vegetable "Mediterranean diet" helps to reduce your risk of heart disease and many cancers. Exercis and vey limited amouts of alcohol can help raise the HDL cholesterol. Yours was slightly low    Carlota Raspberry, M.D.  ______

## 2015-11-07 NOTE — Progress Notes (Signed)
Message left at patient voice mail, lab results are posted at My Chart, and he will able to see them.

## 2016-01-29 ENCOUNTER — Encounter (INDEPENDENT_AMBULATORY_CARE_PROVIDER_SITE_OTHER): Payer: Self-pay | Admitting: "Endocrinology

## 2016-06-18 ENCOUNTER — Encounter (INDEPENDENT_AMBULATORY_CARE_PROVIDER_SITE_OTHER): Payer: Self-pay | Admitting: Internal Medicine

## 2016-06-18 ENCOUNTER — Ambulatory Visit (INDEPENDENT_AMBULATORY_CARE_PROVIDER_SITE_OTHER): Payer: Commercial Managed Care - POS | Admitting: Internal Medicine

## 2016-06-18 VITALS — BP 107/72 | HR 78 | Temp 98.1°F | Ht 66.75 in | Wt 161.2 lb

## 2016-06-18 DIAGNOSIS — R55 Syncope and collapse: Secondary | ICD-10-CM

## 2016-06-18 NOTE — Progress Notes (Signed)
Chief Complaint   Patient presents with   . Anxiety   . Dizziness       HPIeizures"    Andrew Boyle is a 56 y.o. male here for evaluation and treatment of the following concerns  Pt had episode this am of feeling light headed while driving, he did not loose consciousness or loose control of his car. He feels he is unsteady with walking. He has a history of pseudoseizures. These sx seemed different than his "seizures" he feel he is functioning now and is stable with his walking.     The patient's past medical history, surgical history, family history, medications and allergies were reviewed.    Allergies   Allergen Reactions   . Ciprofloxacin Hives       Medications  Current Outpatient Prescriptions   Medication Sig Dispense Refill   . aspirin EC 81 MG EC tablet Take 81 mg by mouth daily.     [name] ALPRAZolam (XANAX) 1 MG tablet Take 1 tablet (1 mg total) by mouth nightly as needed for Sleep. 30 tablet 0   . sertraline (ZOLOFT) 25 MG tablet Take 1 tablet (25 mg total) by mouth daily. 30 tablet 3     No current facility-administered medications for this visit.        Review of Systems  CONST: No weight change, no fevers/chills/sweats, fatigue, muscle aches, appetite loss  EARS: No hearing loss, tinnitus, pain or discharge.   NOSE: No congestion, runny nose or bloody nose  NECK: No swollen glands, stiffness or pain  CV: No CP, palpitations, leg swelling, changes in exercise tolerance, PND, orthopnea  RESP: No SOB, cough, wheeze, asthma  GI: No n/v, diarrhea, constipation, abd pain, heartburn, blood in stool, early satiety  MSK:  No joint or muscle pain, swelling or weakness  SKIN:   No rashes lumps, sores, concerning moles, or changes in hair or nails.  HEME:  No bruising/bleeding, swollen glands  NEURO:  No HA, LH, dizziness, LOC, numbness/tingling, weakness  PSYCH:  No depressed mood, anhedonia, anxiety    Physical Exam:  BP 107/72 (BP Site: Left arm, Patient Position: Sitting, Cuff Size: Medium)   Pulse 78    Temp 98.1 F (36.7 C) (Oral)   Ht 1.695 m (5' 6.75")   Wt 73.1 kg (161 lb 3.2 oz)   BMI 25.44 kg/m   GEN: WD WNalert and appropriate in NAD  HENT: NCAT, TMs normal bilaterally, no nasal congestion, OP normal w no exudate or erythema  EYES: PERRL, EOMI, no pallor or scleral icterus, normal conjunctiva  LYMPH: no cervical or supraclavicular LAD appreciated  CV: RRR, no m/r/g.  No LE edema.  2+ radial pulses present and equal.  RESP: CTAB, normal effort, no rhonchi or rales  GI: soft, nontender/ nondistended, NABS, no rebound or guarding  SKIN: warm, dry.  no visible rashes or concerning moles  MSK: Normal bulk and tone, normal strength 5/5 and sensation UE / LE  NEURO: No CN deficits noted (II-XII) PERRLA, EOMI, face symmetric, hearing intact to voice, palate raise, shoulder shrug and neck flexion intact, tongue midline. MAE.  PSYCH: normal mood and affect    Assessment:  1. 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       Carlota Raspberry, M.D.

## 2016-06-18 NOTE — Progress Notes (Signed)
Have you seen any specialists/other providers since your last visit with [place]?  Jacqulyn Cane, MD Consulting Physician Neurology       Limb alert protocol reviewed?      Yes

## 2017-02-25 ENCOUNTER — Ambulatory Visit (INDEPENDENT_AMBULATORY_CARE_PROVIDER_SITE_OTHER): Payer: Commercial Managed Care - POS | Admitting: Internal Medicine

## 2017-02-25 VITALS — BP 116/72 | HR 87 | Temp 99.2°F | Wt 163.4 lb

## 2017-02-25 DIAGNOSIS — R509 Fever, unspecified: Secondary | ICD-10-CM

## 2017-02-25 DIAGNOSIS — J111 Influenza due to unidentified influenza virus with other respiratory manifestations: Secondary | ICD-10-CM

## 2017-02-25 DIAGNOSIS — R51 Headache: Secondary | ICD-10-CM

## 2017-02-25 DIAGNOSIS — J069 Acute upper respiratory infection, unspecified: Secondary | ICD-10-CM

## 2017-02-25 DIAGNOSIS — R519 Headache, unspecified: Secondary | ICD-10-CM

## 2017-02-25 LAB — POCT INFLUENZA A/B
POCT Rapid Influenza A AG: POSITIVE — AB
POCT Rapid Influenza B AG: NEGATIVE

## 2017-02-25 MED ORDER — OSELTAMIVIR PHOSPHATE 75 MG PO CAPS
75.0000 mg | ORAL_CAPSULE | Freq: Two times a day (BID) | ORAL | 0 refills | Status: AC
Start: 2017-02-25 — End: 2017-03-02

## 2017-02-25 NOTE — Addendum Note (Signed)
Addended by: Aileen Pilot on: 02/25/2017 04:30 PM     Modules accepted: Orders

## 2017-02-25 NOTE — Progress Notes (Signed)
Subjective:      Patient ID: Andrew Boyle is a 56 y.o. male     Chief Complaint   Patient presents with   . Cough   . Generalized Body Aches   . Chills   . Fever   . Nasal Congestion   . Headache        HPI     The patient presents with the above symptoms for 3 days.  They began after exposure to a relative who tested positive for influenza.  There are no other acute issues.    The following sections were reviewed this encounter by the provider:   Problems         Review of Systems   All other systems reviewed and are negative.         BP 116/72 (BP Site: Left arm, Patient Position: Sitting, Cuff Size: Medium)   Pulse 87   Temp 99.2 F (37.3 C) (Oral)   Wt 74.1 kg (163 lb 6.4 oz)   BMI 25.78 kg/m     Objective:     Physical Exam   Constitutional: He appears well-developed and well-nourished. No distress.   HENT:   Head: Normocephalic and atraumatic.   Right Ear: External ear normal.   Left Ear: External ear normal.   There is moderate oropharyngeal erythema, without exudate.   Eyes: Conjunctivae are normal.   Neck: Normal range of motion. Neck supple.   Cardiovascular: Normal rate and regular rhythm.    Pulmonary/Chest: Effort normal and breath sounds normal. He has no wheezes. He has no rales.   Lymphadenopathy:     He has no cervical adenopathy.   Vitals reviewed.       Assessment:     Above symptoms as noted.  Flu swab is positive for influenza A.      Plan:     The patient is given Tamiflu.  Supportive measures are discussed.  He will follow-up as needed.    Lenord Fellers, MD

## 2017-02-25 NOTE — Progress Notes (Signed)
Have you seen any specialists/other providers since your last visit with us?    No    Arm preference verified?   Yes    The patient is due for colonoscopy, influenza vaccine and shingles vaccine

## 2018-01-11 ENCOUNTER — Ambulatory Visit (INDEPENDENT_AMBULATORY_CARE_PROVIDER_SITE_OTHER): Payer: BC Managed Care – HMO | Admitting: Family Medicine

## 2018-01-11 VITALS — BP 119/79 | HR 93 | Temp 98.1°F | Resp 16 | Ht 66.0 in | Wt 165.0 lb

## 2018-01-11 DIAGNOSIS — N3001 Acute cystitis with hematuria: Secondary | ICD-10-CM

## 2018-01-11 LAB — POCT URINALYSIS AUTOMATED (IAH)
Bilirubin, UA POCT: NEGATIVE
Glucose, UA POCT: NEGATIVE
Ketones, UA POCT: NEGATIVE mg/dL
Nitrite, UA POCT: NEGATIVE
PH, UA POCT: 7 (ref 4.6–8)
Protein, UA POCT: 30 mg/dL — AB
Specific Gravity, UA POCT: 1.01 mg/dL (ref 1.001–1.035)
Urobilinogen, UA POCT: 0.2 mg/dL

## 2018-01-11 MED ORDER — SULFAMETHOXAZOLE-TRIMETHOPRIM 800-160 MG PO TABS
1.0000 | ORAL_TABLET | Freq: Two times a day (BID) | ORAL | 0 refills | Status: AC
Start: 2018-01-11 — End: 2018-01-18

## 2018-01-11 NOTE — Progress Notes (Signed)
Subjective:      Patient ID: Andrew Boyle is a 57 y.o. male     Chief Complaint   Patient presents with   . Urinary Tract Infection Symptoms     urinating frequently w/ blood        HPI   Since yesterday morning had increased urgency, frequency and dysuria. He has noticed some blood as well. He has a mild back pain. Feels less sharp mentally than usual. Thinks may be more dehydrated than usual, but nothing too extreme. No abd pain. Eating normally. He thinks he is improved since yesterday.     The following sections were reviewed this encounter by the provider:   Allergies  Meds  Problems         Review of Systems    No fever. No bowel problems.       BP 119/79   Pulse 93   Temp 98.1 F (36.7 C) (Oral)   Resp 16   Ht 1.676 m (5\' 6" )   Wt 74.8 kg (165 lb)   SpO2 98%   BMI 26.63 kg/m      Objective:     Physical Exam   Constitutional: He is oriented to person, place, and time. He appears well-developed and well-nourished.   HENT:   Head: Normocephalic and atraumatic.   Cardiovascular: Normal rate, regular rhythm and normal heart sounds.  Exam reveals no friction rub.    No murmur heard.  Pulmonary/Chest: Effort normal and breath sounds normal. No respiratory distress. He has no wheezes.   Abdominal: Soft. Bowel sounds are normal. He exhibits no distension. There is no tenderness.   Neurological: He is alert and oriented to person, place, and time.   Skin: Skin is warm and dry. Capillary refill takes less than 2 seconds.   Psychiatric: He has a normal mood and affect. His behavior is normal.   Nursing note and vitals reviewed.      Assessment:     1. Acute cystitis with hematuria  - Urine Culture  - sulfamethoxazole-trimethoprim (BACTRIM DS) 800-160 MG per tablet; Take 1 tablet by mouth 2 (two) times daily for 7 days  Dispense: 14 tablet; Refill: 0  - POCT UA  Automated (urine dipstick)        Plan:     Patient Instructions   Your urine was concerning for a urinary tract infection.  Start taking  the antibiotic.  I will let you know the results of the culture when it is back.        Terressa Koyanagi, MD

## 2018-01-11 NOTE — Patient Instructions (Signed)
Your urine was concerning for a urinary tract infection.  Start taking the antibiotic.  I will let you know the results of the culture when it is back.

## 2018-01-13 ENCOUNTER — Telehealth (INDEPENDENT_AMBULATORY_CARE_PROVIDER_SITE_OTHER): Payer: Self-pay | Admitting: Family Medicine

## 2018-01-13 NOTE — Telephone Encounter (Signed)
Patient wife called and reported her husband still have elevated temp.chills, urgency and constipation.patient current on Bactrim ABT for UTI.  Dr. Alphonsa Gin notified, suggested patient go to ER.   Patient wife aware.

## 2018-01-17 ENCOUNTER — Ambulatory Visit (INDEPENDENT_AMBULATORY_CARE_PROVIDER_SITE_OTHER): Payer: BC Managed Care – HMO | Admitting: Internal Medicine

## 2018-01-17 ENCOUNTER — Encounter (INDEPENDENT_AMBULATORY_CARE_PROVIDER_SITE_OTHER): Payer: Self-pay | Admitting: Internal Medicine

## 2018-01-17 VITALS — BP 108/72 | HR 73 | Temp 98.1°F | Ht 66.75 in | Wt 175.0 lb

## 2018-01-17 DIAGNOSIS — T7840XA Allergy, unspecified, initial encounter: Secondary | ICD-10-CM

## 2018-01-17 NOTE — Progress Notes (Signed)
Have you seen any specialists/other providers since your last visit with us?    No    Arm preference verified?   Yes    The patient is due for colonoscopy and Hep C scre.

## 2018-01-17 NOTE — Progress Notes (Signed)
Chief Complaint   Patient presents with   . Rash     from lower abdomen to upper chest.       HPI    Andrew Boyle is a 57 y.o. male here for evaluation and treatment of the following concerns  Pt had what seemed to be a uti and took TM/Sulfa bid and took fo 6 days an the developed hives and a temp. Last doe of antibitic 24 hours ago     The patient's past medical history, surgical history, family history, medications and allergies were reviewed.    Allergies   Allergen Reactions   . Sulfa Antibiotics Rash   . Ciprofloxacin Hives       Medications  Current Outpatient Prescriptions   Medication Sig Dispense Refill   . sulfamethoxazole-trimethoprim (BACTRIM DS) 800-160 MG per tablet Take 1 tablet by mouth 2 (two) times daily for 7 days 14 tablet 0   . sertraline (ZOLOFT) 25 MG tablet Take 1 tablet (25 mg total) by mouth daily. 30 tablet 3     No current facility-administered medications for this visit.        Review of Systems  CONST: No weight change, no fevers/chills/sweats, fatigue, muscle aches, appetite loss  EARS: No hearing loss, tinnitus, pain or discharge.   NOSE: No congestion, runny nose or bloody nose  NECK: No swollen glands, stiffness or pain  CV: No CP, palpitations, leg swelling, changes in exercise tolerance, PND, orthopnea  RESP: No SOB, cough, wheeze, asthma  GI: No n/v, diarrhea, constipation, abd pain, heartburn, blood in stool, early satiety  MSK:  No joint or muscle pain, swelling or weakness  SKIN:   No rashes lumps, sores, concerning moles, or changes in hair or nails.  HEME:  No bruising/bleeding, swollen glands  NEURO:  No HA, LH, dizziness, LOC, numbness/tingling, weakness  PSYCH:  No depressed mood, anhedonia, anxiety    Physical Exam:  BP 108/72   Pulse 73   Temp 98.1 F (36.7 C) (Oral)   Ht 1.695 m (5' 6.75")   Wt 79.4 kg (175 lb)   BMI 27.61 kg/m   GEN: WD WNalert and appropriate in NAD  HENT: NCAT, TMs normal bilaterally, no nasal congestion, OP normal w no exudate or  erythema  EYES: PERRL, EOMI, no pallor or scleral icterus, normal conjunctiva  LYMPH: no cervical or supraclavicular LAD appreciated  CV: RRR, no m/r/g.  No LE edema.  2+ radial pulses present and equal.  RESP: CTAB, normal effort, no rhonchi or rales  GI: soft, nontender/ nondistended, NABS, no rebound or guarding  SKIN: warm, dry.  no visible rashes or concerning moles, difuse macular papular rashover chest and abdomen.   MSK: Normal bulk and tone, normal strength 5/5 and sensation UE / LE  NEURO: No CN deficits noted (II-XII) PERRLA, EOMI, face symmetric, hearing intact to voice, palate raise, shoulder shrug and neck flexion intact, tongue midline. MAE.  PSYCH: normal mood and affect    Assessment:  1. Allergic reaction, initial encounter        Plan:  Rash on trunk while on sulfa now off mediction and not toxic.       Carlota Raspberry, M.D.

## 2018-09-25 ENCOUNTER — Telehealth (INDEPENDENT_AMBULATORY_CARE_PROVIDER_SITE_OTHER): Payer: BC Managed Care – HMO | Admitting: Internal Medicine

## 2020-01-16 ENCOUNTER — Encounter (INDEPENDENT_AMBULATORY_CARE_PROVIDER_SITE_OTHER): Payer: BC Managed Care – HMO | Admitting: Internal Medicine

## 2020-08-04 ENCOUNTER — Other Ambulatory Visit (FREE_STANDING_LABORATORY_FACILITY): Payer: BC Managed Care – HMO

## 2020-08-04 ENCOUNTER — Encounter (INDEPENDENT_AMBULATORY_CARE_PROVIDER_SITE_OTHER): Payer: Self-pay | Admitting: Internal Medicine

## 2020-08-04 ENCOUNTER — Ambulatory Visit (INDEPENDENT_AMBULATORY_CARE_PROVIDER_SITE_OTHER): Payer: BC Managed Care – HMO | Admitting: Internal Medicine

## 2020-08-04 VITALS — BP 110/73 | HR 74 | Temp 97.4°F | Ht 66.0 in | Wt 153.0 lb

## 2020-08-04 DIAGNOSIS — Z1211 Encounter for screening for malignant neoplasm of colon: Secondary | ICD-10-CM

## 2020-08-04 DIAGNOSIS — M461 Sacroiliitis, not elsewhere classified: Secondary | ICD-10-CM

## 2020-08-04 DIAGNOSIS — Z0000 Encounter for general adult medical examination without abnormal findings: Secondary | ICD-10-CM

## 2020-08-04 DIAGNOSIS — F410 Panic disorder [episodic paroxysmal anxiety] without agoraphobia: Secondary | ICD-10-CM

## 2020-08-04 DIAGNOSIS — R569 Unspecified convulsions: Secondary | ICD-10-CM

## 2020-08-04 LAB — CBC AND DIFFERENTIAL
Absolute NRBC: 0 10*3/uL (ref 0.00–0.00)
Basophils Absolute Automated: 0.04 10*3/uL (ref 0.00–0.08)
Basophils Automated: 0.6 %
Eosinophils Absolute Automated: 0.49 10*3/uL — ABNORMAL HIGH (ref 0.00–0.44)
Eosinophils Automated: 7 %
Hematocrit: 48.5 % (ref 37.6–49.6)
Hgb: 15.7 g/dL (ref 12.5–17.1)
Immature Granulocytes Absolute: 0.01 10*3/uL (ref 0.00–0.07)
Immature Granulocytes: 0.1 %
Lymphocytes Absolute Automated: 2.33 10*3/uL (ref 0.42–3.22)
Lymphocytes Automated: 33 %
MCH: 28.9 pg (ref 25.1–33.5)
MCHC: 32.4 g/dL (ref 31.5–35.8)
MCV: 89.2 fL (ref 78.0–96.0)
MPV: 11.3 fL (ref 8.9–12.5)
Monocytes Absolute Automated: 0.49 10*3/uL (ref 0.21–0.85)
Monocytes: 7 %
Neutrophils Absolute: 3.69 10*3/uL (ref 1.10–6.33)
Neutrophils: 52.3 %
Nucleated RBC: 0 /100 WBC (ref 0.0–0.0)
Platelets: 221 10*3/uL (ref 142–346)
RBC: 5.44 10*6/uL (ref 4.20–5.90)
RDW: 14 % (ref 11–15)
WBC: 7.05 10*3/uL (ref 3.10–9.50)

## 2020-08-04 LAB — URINALYSIS
Bilirubin, UA: NEGATIVE
Blood, UA: NEGATIVE
Glucose, UA: NEGATIVE
Ketones UA: NEGATIVE
Leukocyte Esterase, UA: NEGATIVE
Nitrite, UA: NEGATIVE
Protein, UR: NEGATIVE
Specific Gravity UA: 1.024 (ref 1.001–1.035)
Urine pH: 6 (ref 5.0–8.0)
Urobilinogen, UA: 0.2 (ref 0.2–2.0)

## 2020-08-04 LAB — LIPID PANEL
Cholesterol / HDL Ratio: 4.8
Cholesterol: 202 mg/dL — ABNORMAL HIGH (ref 0–199)
HDL: 42 mg/dL (ref 40–9999)
LDL Calculated: 142 mg/dL — ABNORMAL HIGH (ref 0–99)
Triglycerides: 88 mg/dL (ref 34–149)
VLDL Calculated: 18 mg/dL (ref 10–40)

## 2020-08-04 LAB — T4, FREE: T4 Free: 0.9 ng/dL (ref 0.70–1.48)

## 2020-08-04 LAB — HEMOLYSIS INDEX: Hemolysis Index: 209 — ABNORMAL HIGH (ref 0–24)

## 2020-08-04 LAB — PSA: Prostate Specific Antigen, Total: 5.442 ng/mL — ABNORMAL HIGH (ref 0.000–4.000)

## 2020-08-04 LAB — GFR: EGFR: >60

## 2020-08-04 LAB — TSH: TSH: 2.24 u[IU]/mL (ref 0.35–4.94)

## 2020-08-04 MED ORDER — CLONAZEPAM 0.5 MG PO TABS
ORAL_TABLET | ORAL | 0 refills | Status: DC
Start: 2020-08-04 — End: 2022-06-03

## 2020-08-04 NOTE — Progress Notes (Signed)
Chief Complaint   Patient presents with    Annual Exam     fasting       History of Present Illness    Andrew Boyle is a 60 y.o. male   The patient presents for a preventive health exam. He has presented the following  concerns healht .Andrew Boyle He is exercising 0 times a week. Andrew Boyle is eating a low sugar, low salt and low fat diet and  is a nonsmoker and drinks an average of   4 drinks per months per. Overall stress is a significant problem in his life. Andrew Boyle is well aware of the major preventive health practices and screening preventive health options and expresses commitment to these in the future.     Panic attack lasting 10 tp 15 seconds with stuttering and disruption of thought and speech. Panic attack vs TIA vs seizure. Happens with poor sleep and anxiety .    Mild rt mid abd pain/pressure /tension. Lasted 3 days last week.     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 No pertinent surgical history.    Health Maintenance    There is no immunization history on file for this patient.  Health Maintenance   Topic Date Due    Colorectal Cancer Screening  Never done    Shingrix Vaccine 50+ (2) 03/08/2018    DEPRESSION SCREENING  01/18/2019    INFLUENZA VACCINE  Never done   :  Current Outpatient Medications   Medication Sig Dispense Refill    sertraline (ZOLOFT) 25 MG tablet Take 1 tablet (25 mg total) by mouth daily. 30 tablet 3     No current facility-administered medications for this visit.     Allergies   Allergen Reactions    Sulfa Antibiotics Rash    Ciprofloxacin Hives     Social History      Socioeconomic History    Marital status: Married   Tobacco Use    Smoking status: Never Smoker    Smokeless tobacco: Never Used   Substance and Sexual Activity    Alcohol use: Yes     Alcohol/week: 0.0 standard drinks     Types: 3 - 4 Glasses of wine per week    Drug use: No    Sexual activity: Yes     Partners: Female     Family History   Problem Relation Age of Onset    Heart disease Mother         MI    Parkinsonism Father        Review of Systems  CONST: No weight change, no fevers/chills or sweats, fatigue, muscle aches, appetite loss  EYES: No blurry vision, double vision, loss of peripheral vision  EARS: No hearing loss, tinnitus, pain or discharge.   NOSE: No congestion, runny nose or bloody nose  MOUTH: No sore throat, bleeding gums, difficulty swallowing  NECK: No swollen glands, stiffness or pain  CV: No CP, palpitations, leg swelling, no changes in exercise tolerance, no PND, no orthopnea  RESP: No SOB, cough, wheeze,  asthma  GI: No n/v, diarrhea, constipation, abd pain, heartburn, blood in stool, early satiety  MALE GU: No dysuria, frequency, hematuria, nocturia, testicular changes, penile discharge, erectile dysfunction  nocturia,  MSK:  No joint or muscle pain, swelling or weakness  SKIN:   No rashes lumps, sores or concerning moles. No changes in hair or nails.  ENDO:  No heat/cold intolerance, polyuria, polydipsia, polyphagia  HEME:  No bruising/bleeding, swollen glands  NEURO:  No HA, LH, dizziness, LOC, numbness/tingling, weakness  PSYCH:  No depressed mood, anhedonia, anxiety    Physical Exam:  BP 110/73    Pulse 74    Temp 97.4 F (36.3 C) (Temporal)    Ht 1.676 m (5\' 6" )    Wt 69.4 kg (153 lb)    SpO2 98%    BMI 24.69 kg/m   Wt Readings from Last 3 Encounters:   08/04/20 69.4 kg (153 lb)   01/17/18 79.4 kg (175 lb)   01/11/18 74.8 kg (165 lb)     GEN: WD WN  M  alert and appropriate in NAD  HENT: NCAT, TMs normal bilaterally, no nasal congestion, OP normal w no exudate or  erythema  EYES: PERRL, EOMI, no pallor or scleral icterus, normal conjunctiva  NECK: supple, no thyromegaly or nodules appreciated  LYMPH: no cervical or supraclavicular LAD appreciated  CV: RRR, no m/r/g.  No LE edema.  2+ radial pulses present and equal.  RESP: CTAB, normal effort, no rhonchi or rales  GI: soft, nontender/ nondistended, NABS, no rebound or guarding, no evident HSM  GU: NEMG no masses or hernia evident or palpable  SKIN: warm, dry.  no visible rashes or concerning moles  MSK: Normal bulk and tone, normal strength 5/5 and sensation UE / LE  NEURO: No CN deficits noted (II-XII) PERRLA, EOMI, face symmetric, hearing intact to voice, palate raise, shoulder shrug and neck flexion intact, tongue midline. MAE.  PSYCH: normal mood and affect    Assessment:  There are no diagnoses linked to this encounter.      Plan:    The patient's history including medical,surgical,social and family were reviewed. Allergies, immunizations , medications and health screening were reviewed. Risk behaviors in the areas of smoking, alcohol, sexual activity, diet and accidental injury were discussed. The patient was counseled about lifestyle measures that may reduce risk of vascular disease and cancer.    panic attacks?   Will give clonazepam for use during prodrome.   abd pian  Evaluate gall bladder.     No follow-ups on file.    Andrew Boyle, M.D.

## 2020-08-04 NOTE — Progress Notes (Signed)
Have you seen any specialists/other providers since your last visit with us?    No    Arm preference verified?   Yes    The patient is due for colonoscopy , shingles vaccine.

## 2020-08-05 ENCOUNTER — Other Ambulatory Visit (INDEPENDENT_AMBULATORY_CARE_PROVIDER_SITE_OTHER): Payer: Self-pay | Admitting: Internal Medicine

## 2020-08-05 ENCOUNTER — Telehealth (INDEPENDENT_AMBULATORY_CARE_PROVIDER_SITE_OTHER): Payer: Self-pay | Admitting: Internal Medicine

## 2020-08-05 DIAGNOSIS — R972 Elevated prostate specific antigen [PSA]: Secondary | ICD-10-CM

## 2020-08-05 NOTE — Telephone Encounter (Signed)
Message left on lab results. The need for follow up lab testing was relayed.Carlota Raspberry, M.D.

## 2020-08-05 NOTE — Progress Notes (Signed)
Thank you for coming in for a physical. It is a great way to review and take an assessment of your health and possible preventive strategies. It is a good idea to make a yearly physical a regular part of your routine. You did have some abnormal lab results.     Your PSA is elevated above the normal range. We should repeat it in one to two months. This level is no alarming but need follow up . I will be gald to have you see a urologist if our number continues to rise.

## 2020-08-06 LAB — COMPREHENSIVE METABOLIC PANEL
ALT: 23 U/L (ref 0–55)
Albumin: 4.3 g/dL (ref 3.5–5.0)
Alkaline Phosphatase: 89 U/L (ref 37–117)
Anion Gap: 8 (ref 5.0–15.0)
BUN: 22 mg/dL (ref 9.0–28.0)
Bilirubin, Total: 1 mg/dL (ref 0.2–1.2)
CO2: 28 mEq/L (ref 21–29)
Calcium: 9.4 mg/dL (ref 8.5–10.5)
Chloride: 104 mEq/L (ref 100–111)
Creatinine: 1.2 mg/dL (ref 0.5–1.5)
Globulin: UNDETERMINED g/dL (ref 2.0–3.7)
Glucose: 89 mg/dL (ref 70–100)
Sodium: 140 mEq/L (ref 136–145)

## 2020-08-26 ENCOUNTER — Other Ambulatory Visit (FREE_STANDING_LABORATORY_FACILITY): Payer: BC Managed Care – HMO

## 2020-08-26 DIAGNOSIS — R972 Elevated prostate specific antigen [PSA]: Secondary | ICD-10-CM

## 2020-08-26 LAB — REFLEX - PSA FREE: PSA, Free: 1 ng/mL — ABNORMAL HIGH (ref 0.000–0.500)

## 2020-08-26 LAB — PSA TOTAL (REFLEX TO FREE): PSA Total w/Reflex TO PSA Free: 4.059 ng/mL — ABNORMAL HIGH (ref 0.000–4.000)

## 2020-08-26 LAB — REFLEX - PSA FREE AND TOTAL RATIO: PSA Free and Total Ratio: 0.246

## 2020-10-08 IMAGING — US US ABDOMEN WALL LTD
1 series · 14 of 15 positions shown · non-contrast
Comparison: None.

REASON FOR EXAM: Periumbilical swelling, mass

[Series 1: us abdomen wall ltd · 14 of 15 slices shown]
[im 1/15]
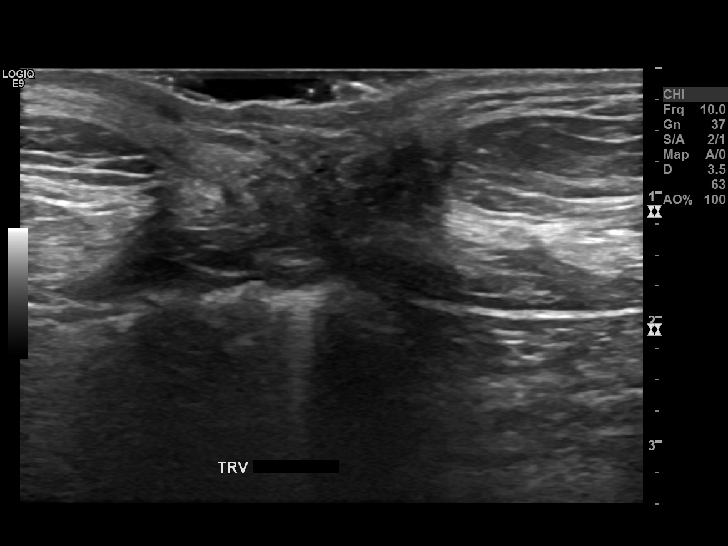
[im 2/15]
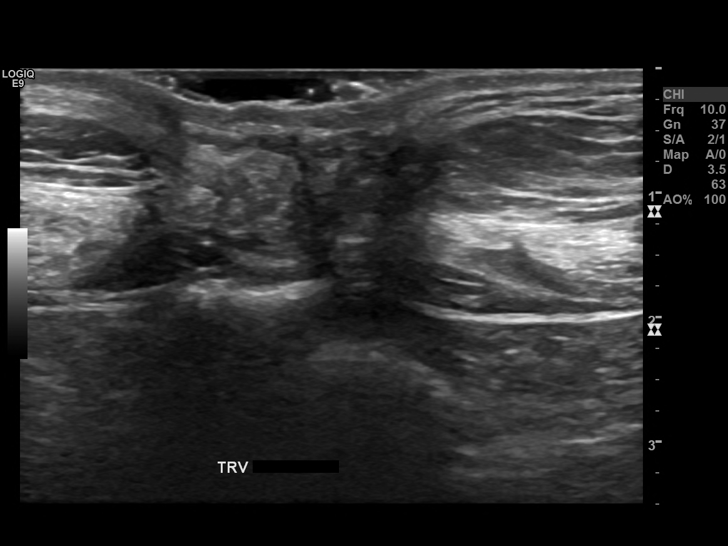
[im 3/15]
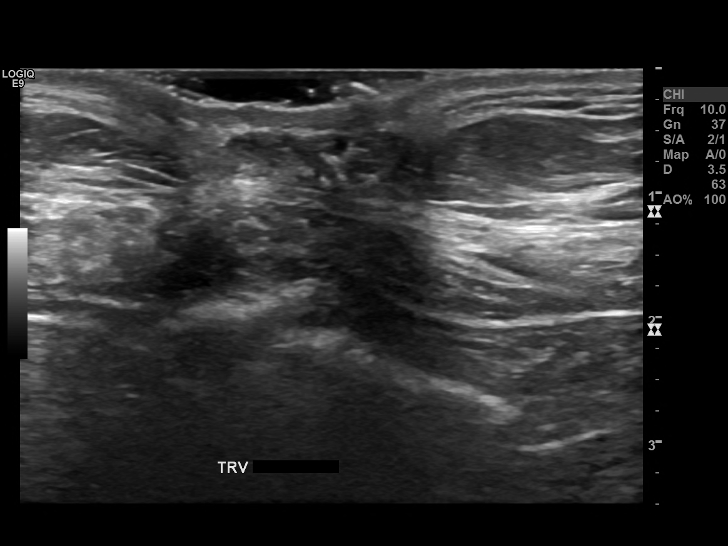
[im 4/15]
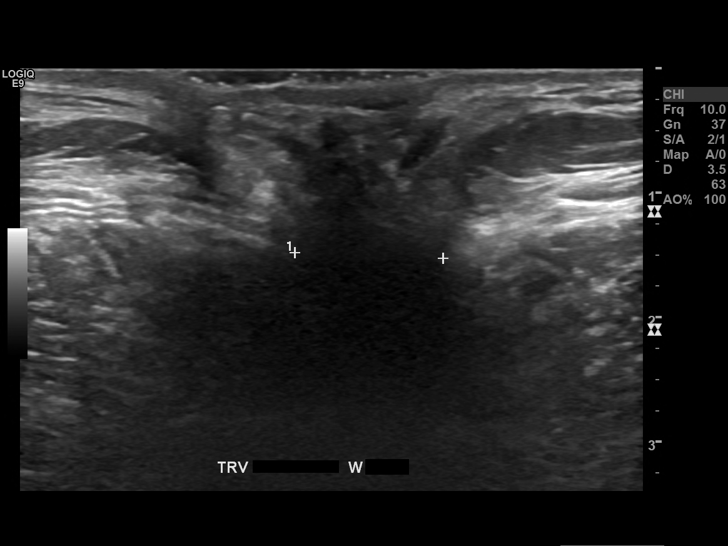
[im 5/15]
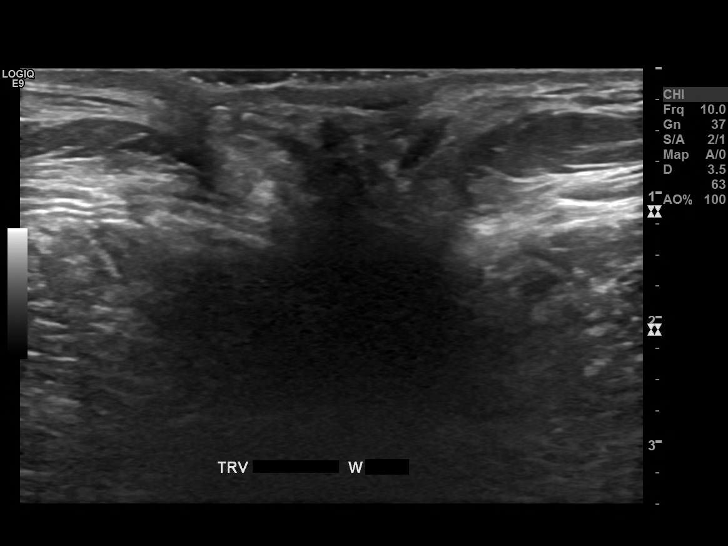
[im 6/15]
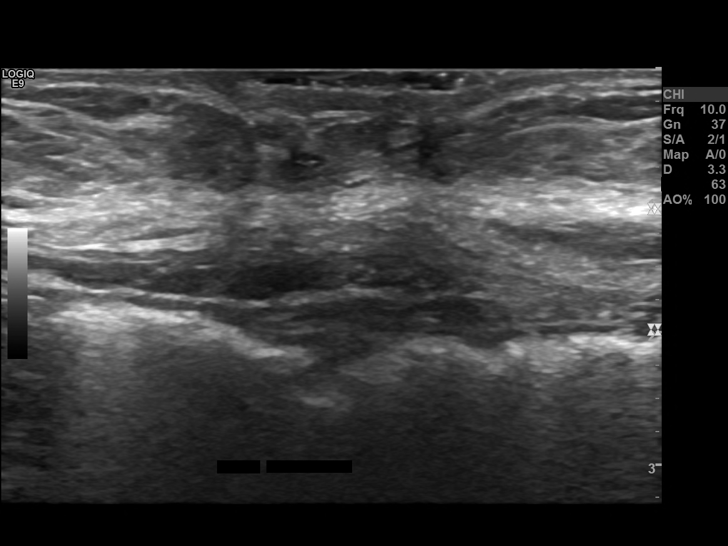
[im 7/15]
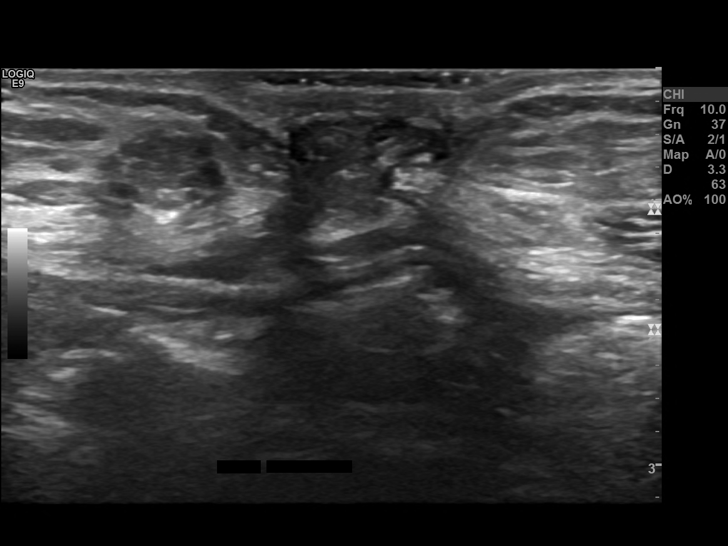
[im 9/15]
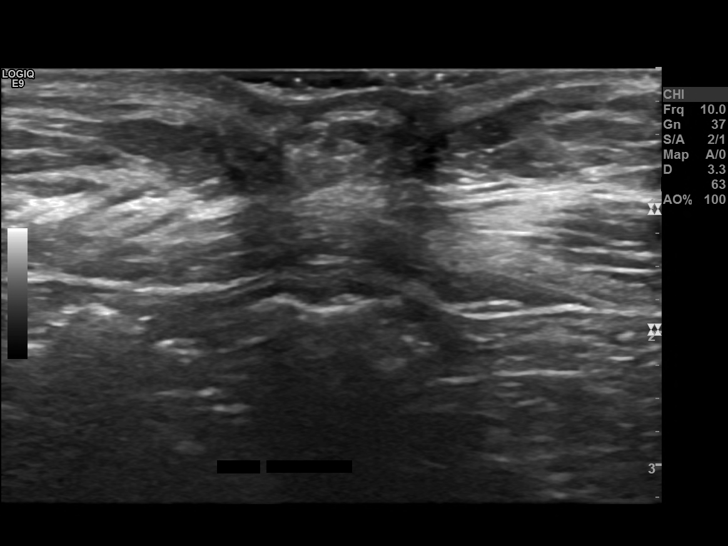
[im 10/15]
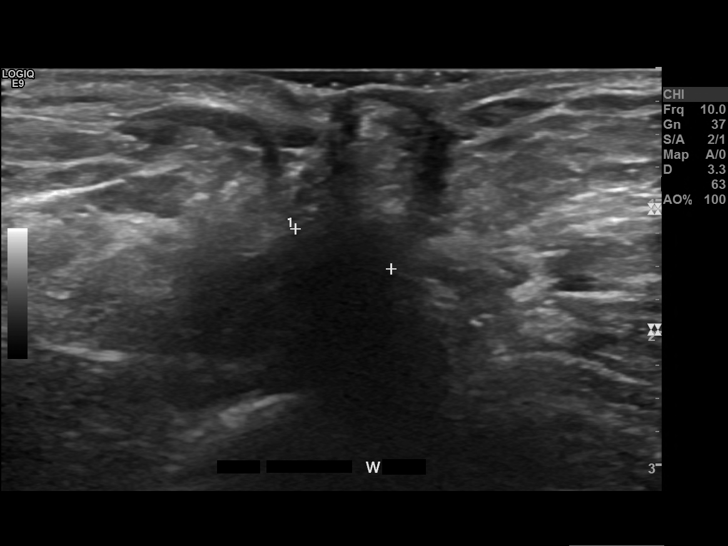
[im 11/15]
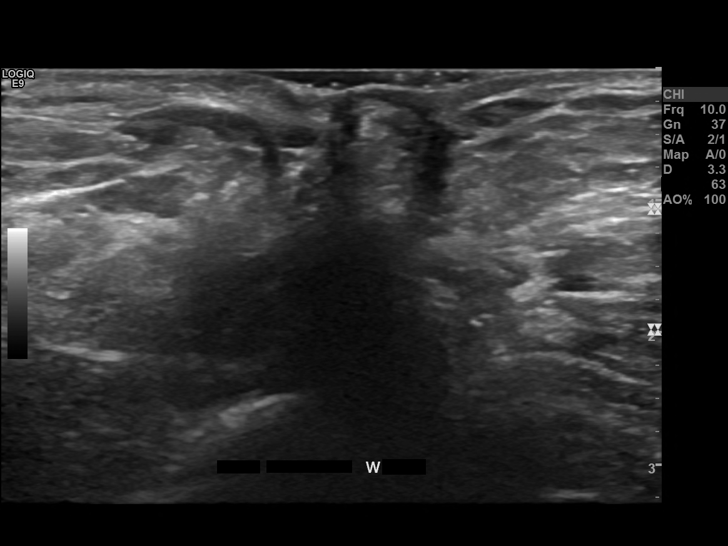
[im 12/15]
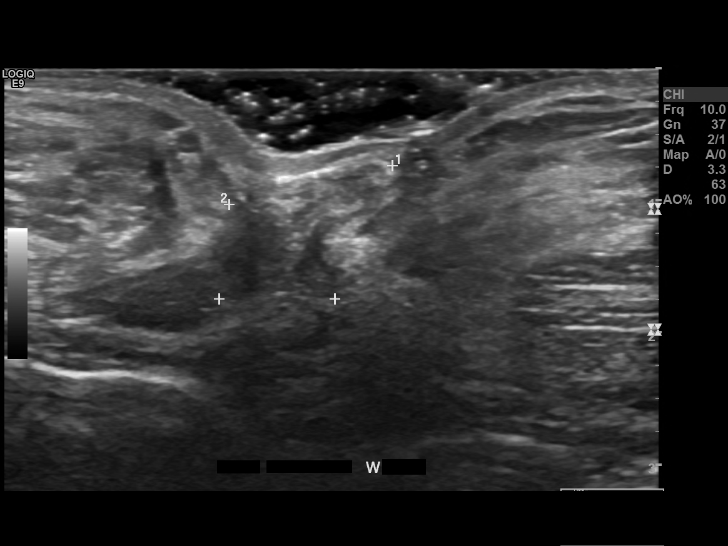
[im 13/15]
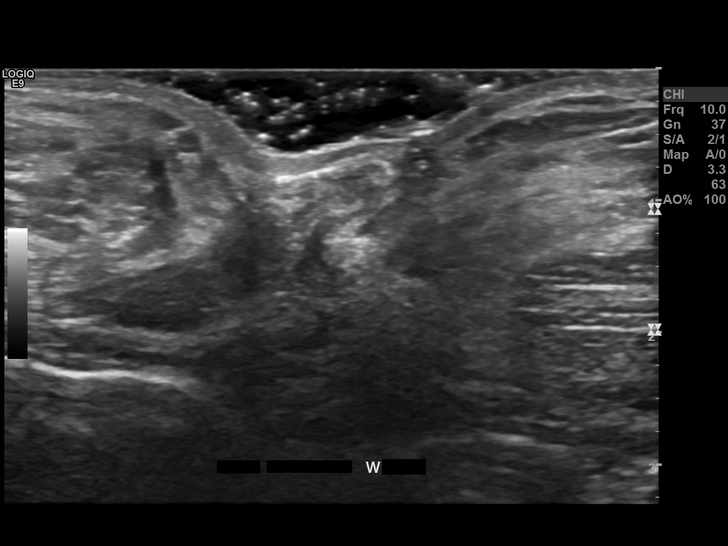
[im 14/15]
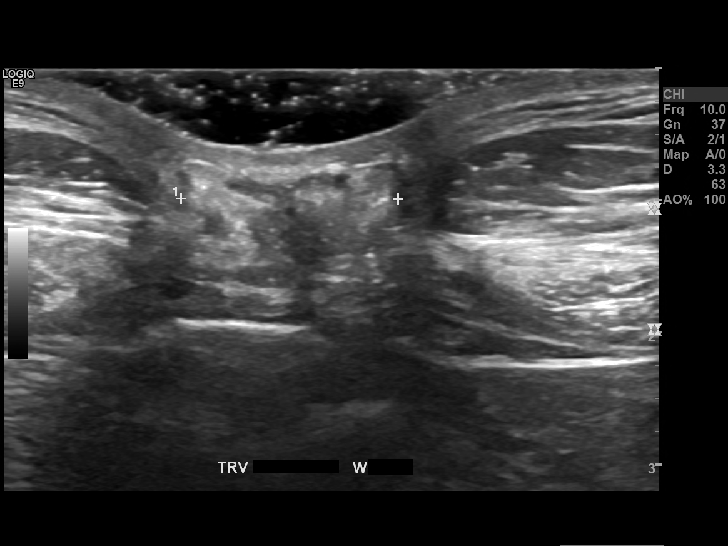
[im 15/15]
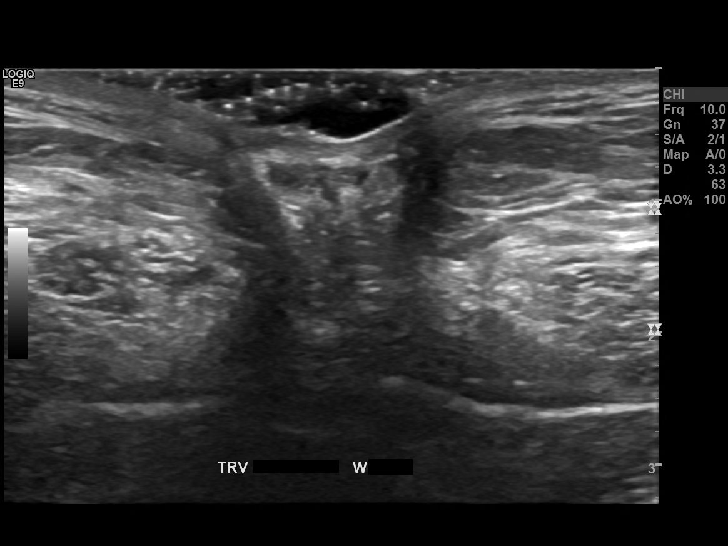

[14 of 15 positions shown; findings below may reference images not displayed]

TECHNIQUE AND FINDINGS: Multiple ultrasound images of the anterior abdominal wall were obtained over the area of clinical concern. Adjacent to the umbilicus there is a deep abdominal wall defect measuring 8 mm wide through which intraperitoneal fat echoes are identified. Bowel is not definitely appreciated.
IMPRESSION: Small periumbilical hernia containing fat.

## 2021-01-16 ENCOUNTER — Encounter (INDEPENDENT_AMBULATORY_CARE_PROVIDER_SITE_OTHER): Payer: Self-pay | Admitting: Internal Medicine

## 2021-01-29 ENCOUNTER — Encounter (INDEPENDENT_AMBULATORY_CARE_PROVIDER_SITE_OTHER): Payer: Self-pay

## 2021-03-03 ENCOUNTER — Encounter (INDEPENDENT_AMBULATORY_CARE_PROVIDER_SITE_OTHER): Payer: Self-pay

## 2021-05-19 ENCOUNTER — Encounter (INDEPENDENT_AMBULATORY_CARE_PROVIDER_SITE_OTHER): Payer: Self-pay

## 2021-11-27 IMAGING — US US ABDOMEN COMPLETE
1 series · 13 of 25 positions shown · non-contrast
Comparison: None.

HISTORY: 59 year-old male with secondary polycythemia
TECHNIQUE: Gray-scale and color and spectral Doppler ultrasound imaging of the abdomen was performed.

[Series 1: us abdomen complete · 13 of 87 slices shown]
[im 1/87]
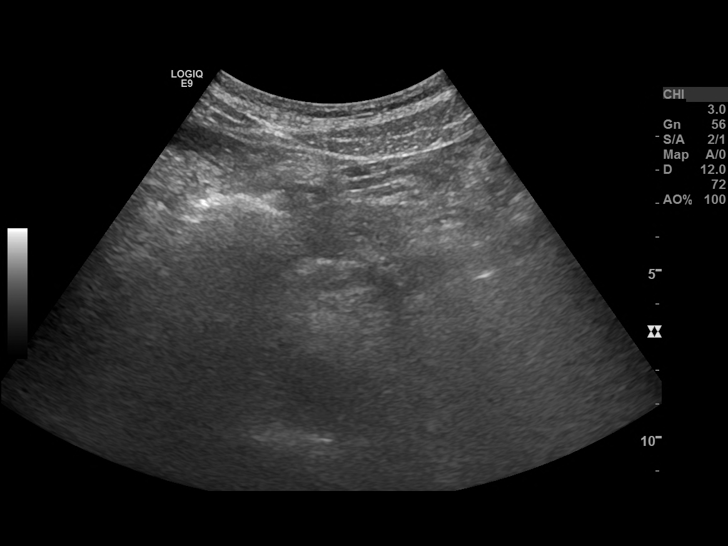
[im 8/87]
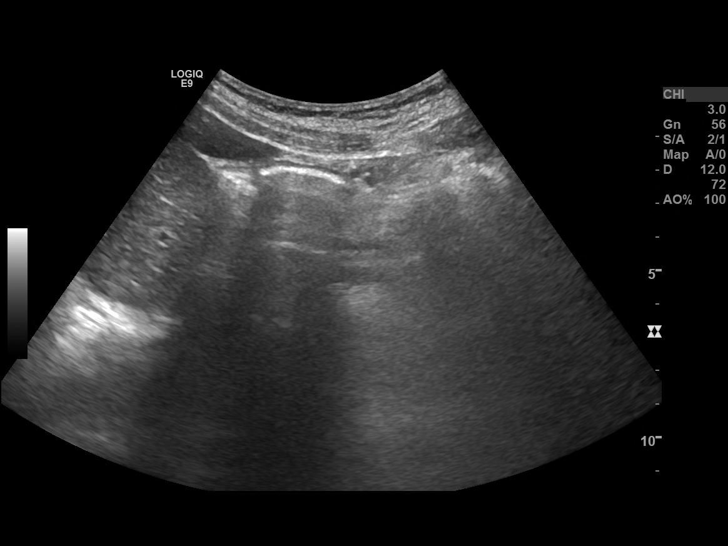
[im 15/87]
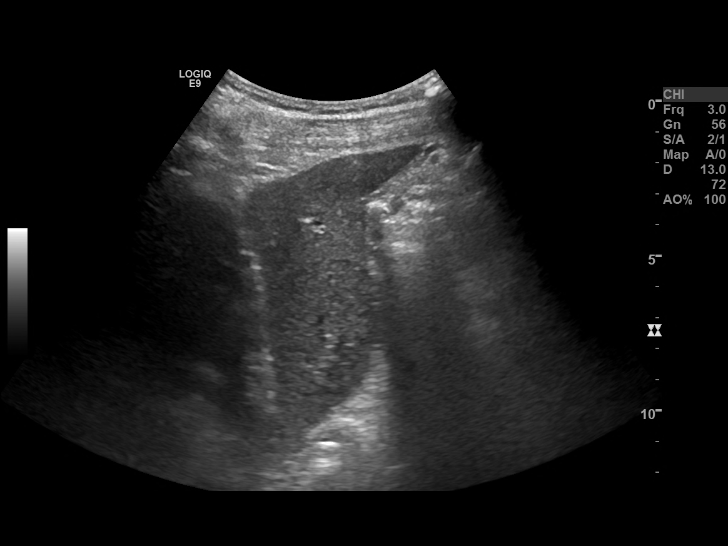
[im 22/87]
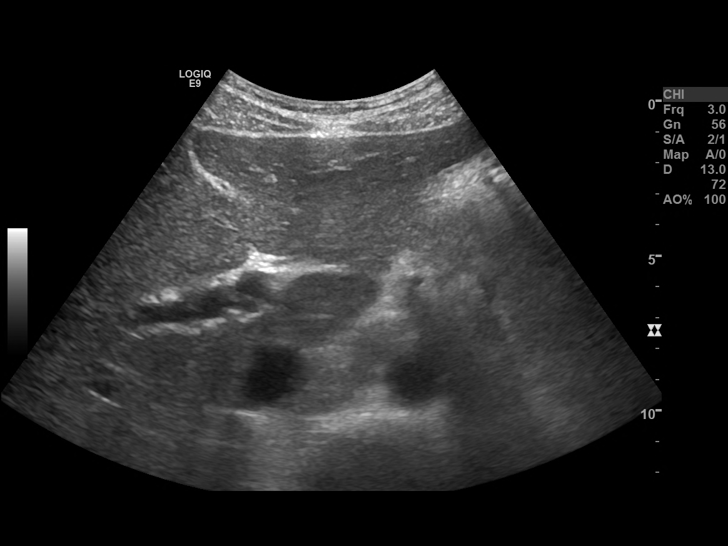
[im 29/87]
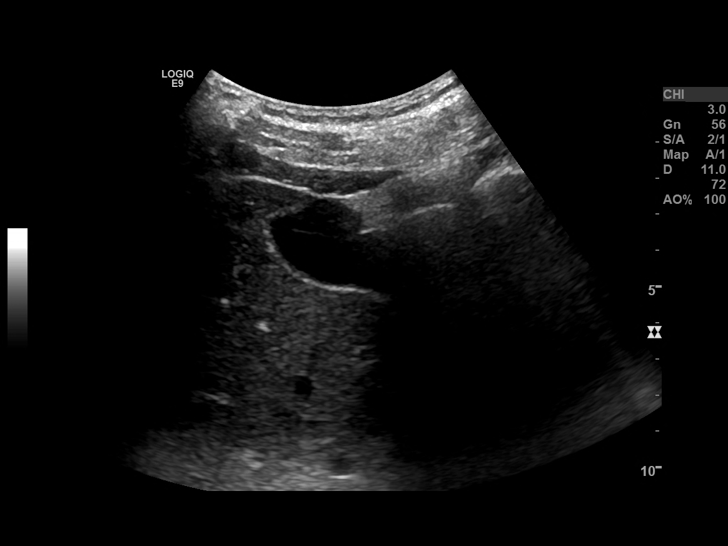
[im 36/87]
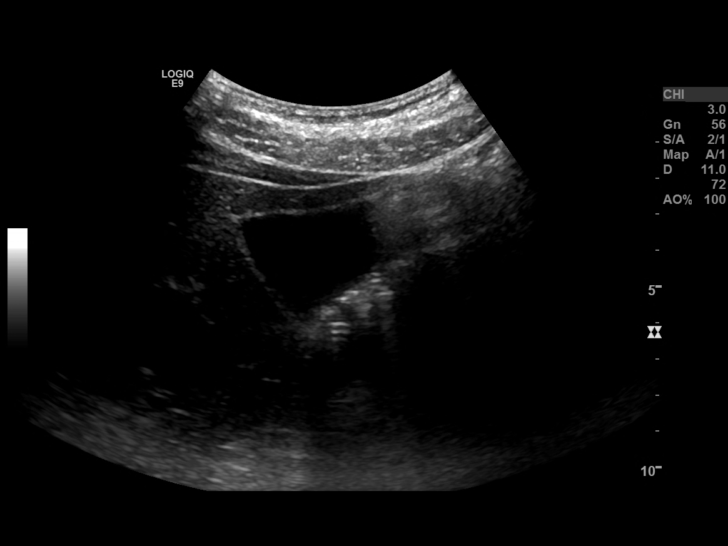
[im 44/87]
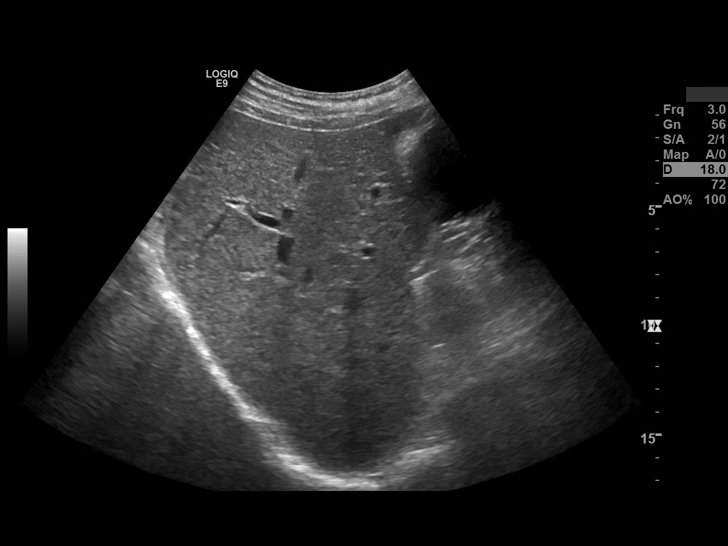
[im 51/87]
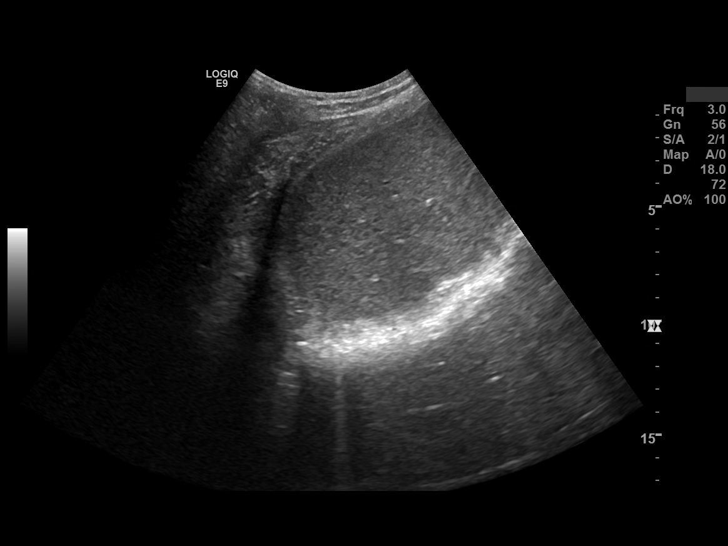
[im 58/87]
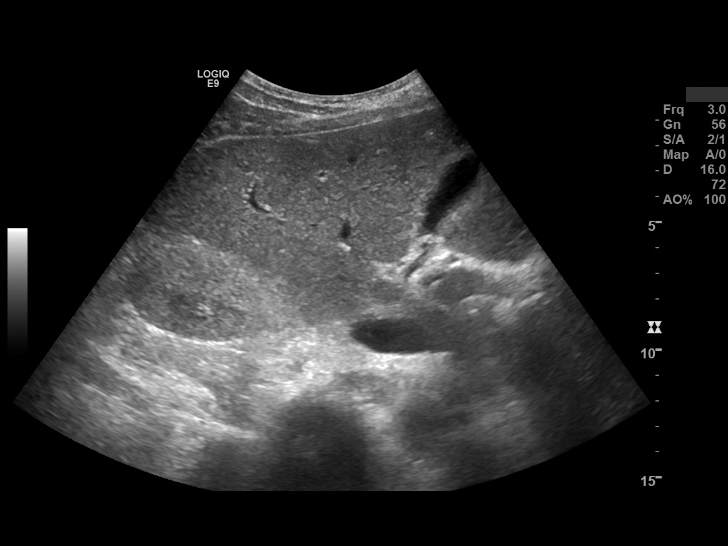
[im 65/87]
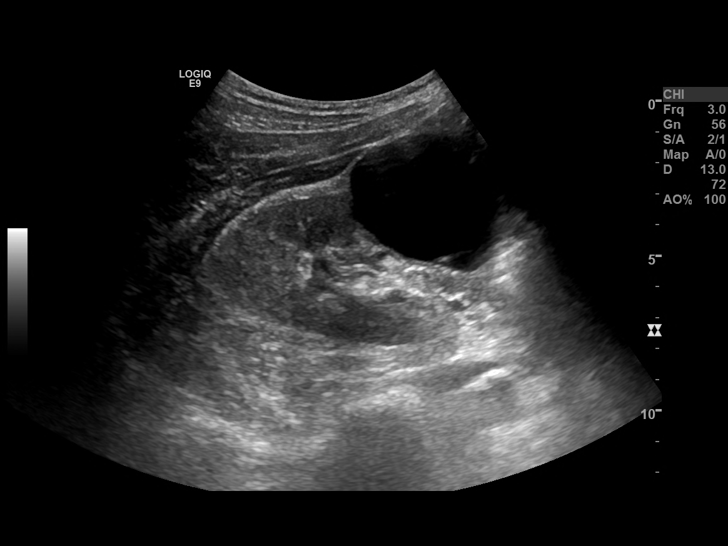
[im 72/87]
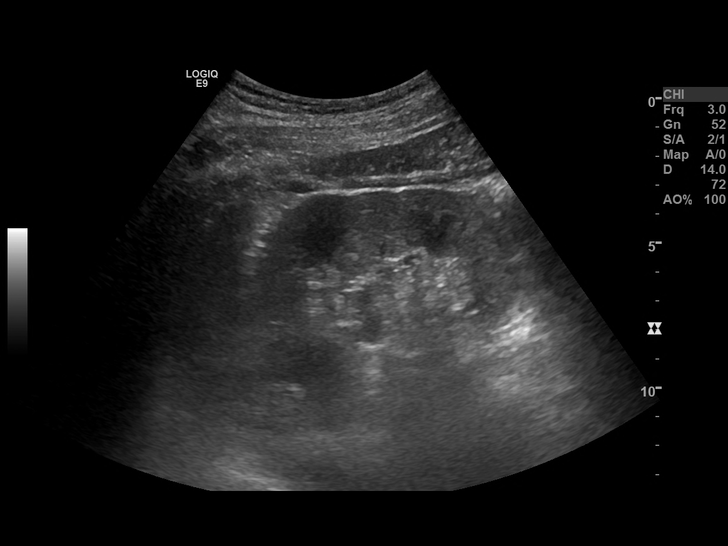
[im 79/87]
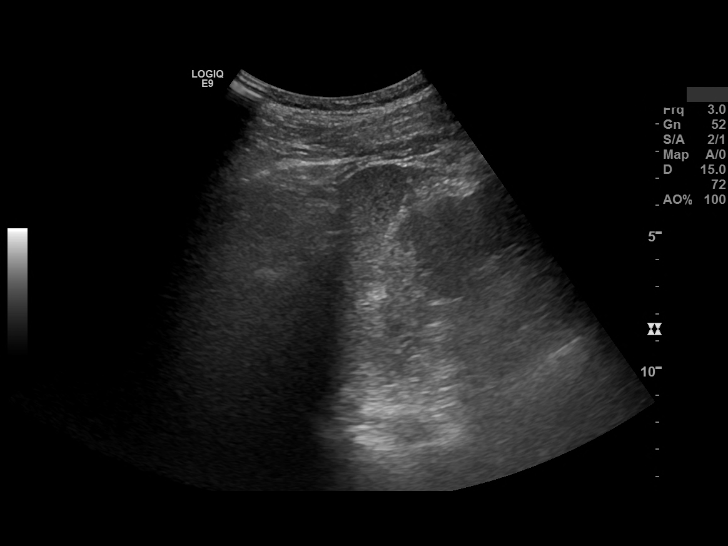
[im 87/87]
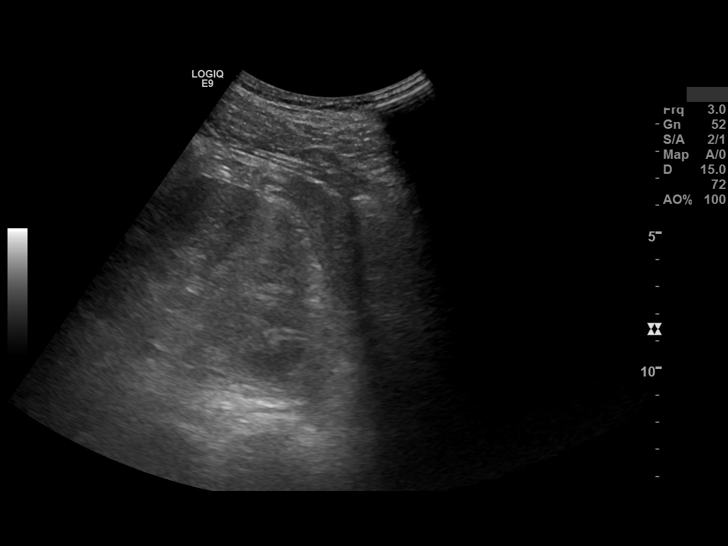

[13 of 25 positions shown; findings below may reference images not displayed]

FINDINGS: Image quality: Degraded by body habitus and/or bowel gas artifact.

PANCREAS: 

Partially imaged without significant abnormality.

LIVER: 

*
Size: Normal in size, measuring 146 mm in length

*
Parenchymal Echogenicity: Increased

*
Parenchymal Echotexture: Coarsened

*
Surface: Smooth

*
Focal Hepatic Lesions: None.

*
Portal Vein: Patent with appropriate direction of flow and normal spectral waveform.

BILIARY DUCTAL SYSTEM: 

*
Common Bile Duct: Measures 2.00 mm in greatest diameter. 

GALLBLADDER: 

*
Size: Normal.

*
Gallbladder Wall: No gallbladder wall thickening.

*
Cholelithiasis/Sludge: None identified.

*
Pericholecystic Fluid: None identified.

*
Sonographic Murphy's Sign: Negative per sonographer.

SPLEEN: 

*
Size: Normal, measuring 91 x 44 x39 mm 

*
Echogenicity: Normal

VASCULATURE:

*
Aorta: Unremarkable.

*
IVC: Unremarkable.

KIDNEYS: 

RIGHT:

*
Size: Normal, measuring 135 mm in length.

*
Cortical Echogenicity: Increased.

*
Parenchymal Thickness: Preserved.

*
Stones: None.

*
Hydronephrosis: None.

*
Simple appearing cyst

LEFT:

*
Size: Normal, measuring 129 mm in length.

*
Cortical Echogenicity: Increased.

*
Parenchymal Thickness: Preserved.

*
Stones: None.

*
Hydronephrosis: None.

PERITONEUM: 

No free fluid is identified within the provided images.
IMPRESSION: 1.
Abnormal hepatic parenchymal echogenicity and echotexture could be related to hepatosteatosis and/or other chronic liver disease.

2.
The appearance of the kidneys can be seen with medical renal disease but is not diagnostic by imaging alone. No hydronephrosis.

## 2021-12-29 ENCOUNTER — Encounter (INDEPENDENT_AMBULATORY_CARE_PROVIDER_SITE_OTHER): Payer: Self-pay

## 2022-05-20 ENCOUNTER — Encounter (INDEPENDENT_AMBULATORY_CARE_PROVIDER_SITE_OTHER): Payer: BC Managed Care – HMO | Admitting: Internal Medicine

## 2022-06-03 ENCOUNTER — Encounter (INDEPENDENT_AMBULATORY_CARE_PROVIDER_SITE_OTHER): Payer: Self-pay

## 2022-06-03 ENCOUNTER — Ambulatory Visit (INDEPENDENT_AMBULATORY_CARE_PROVIDER_SITE_OTHER): Payer: BC Managed Care – HMO | Admitting: Internal Medicine

## 2022-06-03 ENCOUNTER — Encounter (INDEPENDENT_AMBULATORY_CARE_PROVIDER_SITE_OTHER): Payer: Self-pay | Admitting: Internal Medicine

## 2022-06-03 VITALS — BP 99/64 | HR 73 | Temp 97.9°F | Ht 67.0 in | Wt 152.0 lb

## 2022-06-03 DIAGNOSIS — R5383 Other fatigue: Secondary | ICD-10-CM

## 2022-06-03 DIAGNOSIS — Z23 Encounter for immunization: Secondary | ICD-10-CM

## 2022-06-03 DIAGNOSIS — F419 Anxiety disorder, unspecified: Secondary | ICD-10-CM

## 2022-06-03 DIAGNOSIS — Z0000 Encounter for general adult medical examination without abnormal findings: Secondary | ICD-10-CM

## 2022-06-03 NOTE — Progress Notes (Signed)
Have you seen any specialists since your last visit with [place]?  Yes    Urology     The patient was informed that the following HM items are still outstanding:   shingles vaccine

## 2022-06-03 NOTE — Progress Notes (Signed)
Chief Complaint   Patient presents with    Annual Exam     Fasting, psa       History of Present Illness    Johnchristopher Sayvion Vigen is a 61 y.o. male   The patient presents for a preventive health exam. He has presented the following  concerns health .[name] He is exercising 0 times a week. Pascual is eating a low sugar, low salt, and low fat diet and  is a nonsmoker and drinks an average of   1 drinks per months per. Overall stress is a significant problem in his life. Wanda Alexavier Tsutsui is well aware of the major preventive health practices and screening preventive health options and expresses commitment to these in the future.    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 No pertinent surgical history.    Health Maintenance  Immunization History   Administered Date(s) Administered    COVID-19 mRNA BIVALENT vaccine 12 years and above [employer]) 30 mcg/0.3 mL 03/22/2021    COVID-19 mRNA MONOVALENT vaccine PRIMARY SERIES 12 years and above [employer]) 30 mcg/0.3 mL (DILUTE BEFORE USE) 04/15/2020    Zoster [facility]) Vaccine Recombinant 06/03/2022     Health Maintenance   Topic Date Due    Tetanus Ten-Year  Never done    HEPATITIS C SCREENING  Never done    Colorectal Cancer Screening  Never done    Annual Exam  08/04/2021    INFLUENZA VACCINE  Never done    COVID-19 Vaccine (3 - 2023-24 season) 02/05/2022    DEPRESSION SCREENING  06/04/2023    Shingrix Vaccine 50+  Addressed   :  Current Outpatient Medications   Medication Sig Dispense Refill    tadalafil (CIALIS) 5 MG tablet Take 1 tablet (5 mg) by mouth daily        No current facility-administered medications for this visit.     Allergies   Allergen Reactions    Sulfa Antibiotics Rash    Ciprofloxacin Hives     Social History     Socioeconomic History    Marital status: Married   Tobacco Use    Smoking status: Never    Smokeless tobacco: Never   Substance and Sexual Activity    Alcohol use: Yes     Alcohol/week: 0.0 standard drinks of alcohol     Types: 3 - 4 Glasses of wine per week    Drug use: No    Sexual activity: Yes     Partners: Female     Social Determinants of Health     Financial Resource Strain: Low Risk  (05/25/2022)    Overall Financial Resource Strain (CARDIA)     Difficulty of Paying Living Expenses: Not hard at all   Food Insecurity: No Food Insecurity (05/25/2022)    Hunger Vital Sign     Worried About Running Out of Food in the Last Year: Never true     Ran Out of Food in the Last Year: Never true  Transportation Needs: No Transportation Needs (05/25/2022)    PRAPARE - Therapist, art (Medical): No     Lack of Transportation (Non-Medical): No   Physical Activity: Inactive (05/25/2022)    Exercise Vital Sign     Days of Exercise per Week: 0 days     Minutes of Exercise per Session: 0 min   Stress: No Stress Concern Present (05/25/2022)    [employer] of Occupational Health - Occupational Stress Questionnaire     Feeling of Stress : Not at all   Social Connections: Moderately Integrated (05/25/2022)    Social Connection and Isolation Panel [NHANES]     Frequency of Communication with Friends and Family: More than three times a week     Frequency of Social Gatherings with Friends and Family: Three times a week     Attends Religious Services: 1 to 4 times per year     Active Member of Clubs or Organizations: No     Attends Banker Meetings: Never     Marital Status: Married   Catering manager Violence: Not At Risk (05/25/2022)    Humiliation, Afraid, Rape, and Kick questionnaire     Fear of Current or Ex-Partner:  No     Emotionally Abused: No     Physically Abused: No     Sexually Abused: No   Housing Stability: Low Risk  (05/25/2022)    Housing Stability Vital Sign     Unable to Pay for Housing in the Last Year: No     Number of Places Lived in the Last Year: 1     Unstable Housing in the Last Year: No     Family History   Problem Relation Age of Onset    Heart disease Mother         MI    Parkinsonism Father        Review of Systems  CONST: No weight change, no fevers/chills or sweats, fatigue, muscle aches, appetite loss  EYES: No blurry vision, double vision, loss of peripheral vision  EARS: No hearing loss, tinnitus, pain or discharge.   NOSE: No congestion, runny nose or bloody nose  MOUTH: No sore throat, bleeding gums, difficulty swallowing  NECK: No swollen glands, stiffness or pain  CV: No CP, palpitations, leg swelling, no changes in exercise tolerance, no PND, no orthopnea  RESP: No SOB, cough, wheeze, asthma  GI: No n/v, diarrhea, constipation, abd pain, heartburn, blood in stool, early satiety  MALE GU: No dysuria, frequency, hematuria, nocturia, testicular changes, penile discharge, erectile dysfunction  nocturia,  MSK:  No joint or muscle pain, swelling or weakness  SKIN:   No rashes lumps, sores or concerning moles. No changes in hair or nails.  ENDO:  No heat/cold intolerance, polyuria, polydipsia, polyphagia  HEME:  No bruising/bleeding, swollen glands  NEURO:  No HA, LH, dizziness, LOC, numbness/tingling, weakness  PSYCH:  No depressed mood, anhedonia, anxiety    Physical Exam:  BP 99/64   Pulse 73   Temp 97.9 F (36.6 C) (Temporal)   Ht 1.702 m (5\' 7" )   Wt 68.9 kg (152 lb)   SpO2 97%   BMI 23.81 kg/m   Wt Readings from Last 3 Encounters:   06/03/22 68.9 kg (152 lb)   08/04/20 69.4 kg (153 lb)   01/17/18 79.4 kg (175 lb)     GEN: WD WN  M  alert and appropriate in NAD  HENT: NCAT, TMs normal bilaterally,  no nasal congestion, OP normal w no exudate or erythema  EYES: PERRL, EOMI, no pallor or  scleral icterus, normal conjunctiva  NECK: supple, no thyromegaly or nodules appreciated  LYMPH: no cervical or supraclavicular LAD appreciated  CV: RRR, no m/r/g.  No LE edema.  2+ radial pulses present and equal.  RESP: CTAB, normal effort, no rhonchi or rales  GI: soft, nontender/ nondistended, NABS, no rebound or guarding, no evident HSM  GU: NEMG no masses or hernia evident or palpable  SKIN: warm, dry.  no visible rashes or concerning moles  MSK: Normal bulk and tone, normal strength 5/5 and sensation UE / LE  NEURO: No CN deficits noted (II-XII) PERRLA, EOMI, face symmetric, hearing intact to voice, palate raise, shoulder shrug and neck flexion intact, tongue midline. MAE.  PSYCH: normal mood and affect    Assessment:  1. Physical exam  - Urinalysis with microscopic  - Lipid panel  - Comprehensive metabolic panel  - CBC and differential  - T4, free  - TSH  - PSA Total (Reflex To Free)    2. Anxiety  - Ambulatory referral to [facility]; Future    3. Need for influenza vaccination    4. Need for shingles vaccine  - Zoster Vaccine Recomb,Adjuvanted (IM)    5. Fatigue, unspecified type  - Testosterone        Plan:    The patient's history including medical,surgical,social and family were reviewed. Allergies, immunizations , medications and health screening were reviewed. Risk behaviors in the areas of smoking, alcohol, sexual activity, diet and accidental injury were discussed. The patient was counseled about lifestyle measures that may reduce risk of vascular disease and cancer.           No follow-ups on file.    Carlota Raspberry, M.D.

## 2022-06-04 LAB — URINALYSIS WITH MICROSCOPIC
Bilirubin, UA: NEGATIVE
Blood, UA: NEGATIVE
Glucose, UA: NEGATIVE
Ketones UA: NEGATIVE
Leukocyte Esterase, UA: NEGATIVE
Nitrite, UA: NEGATIVE
Protein, UR: NEGATIVE
Specific Gravity UA: 1.019 (ref 1.005–1.030)
Urine pH: 7 (ref 5.0–7.5)
Urobilinogen, UA: 0.2 mg/dL (ref 0.2–1.0)

## 2022-06-04 LAB — CBC AND DIFFERENTIAL
Baso(Absolute): 0 10*3/uL (ref 0.0–0.2)
Basophils Automated: 1 %
Eosinophils Absolute: 0.3 10*3/uL (ref 0.0–0.4)
Eosinophils Automated: 4 %
Hematocrit: 44.8 % (ref 37.5–51.0)
Hemoglobin: 14.9 g/dL (ref 13.0–17.7)
Immature Granulocytes Absolute: 0 10*3/uL (ref 0.0–0.1)
Immature Granulocytes: 0 %
Lymphocytes Absolute: 2 10*3/uL (ref 0.7–3.1)
Lymphocytes Automated: 33 %
MCH: 29.4 pg (ref 26.6–33.0)
MCHC: 33.3 g/dL (ref 31.5–35.7)
MCV: 89 fL (ref 79–97)
Monocytes Absolute: 0.5 10*3/uL (ref 0.1–0.9)
Monocytes: 7 %
Neutrophils Absolute Count: 3.5 10*3/uL (ref 1.4–7.0)
Neutrophils: 55 %
Platelets: 232 10*3/uL (ref 150–450)
RBC: 5.06 x10E6/uL (ref 4.14–5.80)
RDW: 13.5 % (ref 11.6–15.4)
WBC: 6.2 10*3/uL (ref 3.4–10.8)

## 2022-06-04 LAB — COMPREHENSIVE METABOLIC PANEL
ALT: 10 IU/L (ref 0–44)
AST (SGOT): 14 IU/L (ref 0–40)
Albumin/Globulin Ratio: 1.7 (ref 1.2–2.2)
Albumin: 4.4 g/dL (ref 3.9–4.9)
Alkaline Phosphatase: 76 IU/L (ref 44–121)
BUN / Creatinine Ratio: 22 (ref 10–24)
BUN: 24 mg/dL (ref 8–27)
Bilirubin, Total: 0.4 mg/dL (ref 0.0–1.2)
CO2: 24 mmol/L (ref 20–29)
Calcium: 9.5 mg/dL (ref 8.6–10.2)
Chloride: 103 mmol/L (ref 96–106)
Creatinine: 1.11 mg/dL (ref 0.76–1.27)
Globulin, Total: 2.6 g/dL (ref 1.5–4.5)
Glucose: 95 mg/dL (ref 70–99)
Potassium: 4.8 mmol/L (ref 3.5–5.2)
Protein, Total: 7 g/dL (ref 6.0–8.5)
Sodium: 140 mmol/L (ref 134–144)
eGFR: 75 mL/min/{1.73_m2} (ref >59)

## 2022-06-04 LAB — REFLEX - MICROSCOPIC EXAMINATION
Bacteria, UA: NONE SEEN
Casts, UA: NONE SEEN /lpf
Epithelial Cells (non renal): NONE SEEN /hpf (ref 0–10)
RBC, UA: NONE SEEN /hpf (ref 0–2)
WBC, UA: NONE SEEN /hpf (ref 0–5)

## 2022-06-04 LAB — LIPID PANEL
Cholesterol / HDL Ratio: 4.7 ratio (ref 0.0–5.0)
Cholesterol: 161 mg/dL (ref 100–199)
HDL: 34 mg/dL — ABNORMAL LOW (ref >39)
LDL Chol Calculated (NIH): 111 mg/dL — ABNORMAL HIGH (ref 0–99)
Triglycerides: 85 mg/dL (ref 0–149)
VLDL Calculated: 16 mg/dL (ref 5–40)

## 2022-06-04 LAB — TSH: TSH: 1.99 u[IU]/mL (ref 0.450–4.500)

## 2022-06-04 LAB — TESTOSTERONE: Testosterone: 522 ng/dL (ref 264–916)

## 2022-06-04 LAB — T4, FREE: T4, Free: 1.21 ng/dL (ref 0.82–1.77)

## 2022-06-07 NOTE — Progress Notes (Signed)
(  CBC) The complete blood count is within normal limits.  There is no evidence of anemia.  White blood cell count and platelet count are within normal limits. (CMP) Liver and kidney function are within normal limits.  Electrolytes and glucose are within acceptable limits. (Lipid panel) The results of the lipid panel are within acceptable limits.  Specifically, the LDL ( bad cholesterol ), triglycerides, and HDL ( good cholesterol ) are within acceptable limits. The urinalysis is within normal limits.  There is no evidence of blood or protein in the urine.    Overall  your labs were normal. Keep up the good work. Eating a low fat and high vegetable "Mediterranean diet" helps to reduce your risk of heart disease and many cancers.     Yanina Knupp, M.D.

## 2022-06-10 LAB — PSA TOTAL (REFLEX TO FREE): Prostate Specific Antigen, Total: 4.9 ng/mL — ABNORMAL HIGH (ref 0.0–4.0)

## 2022-06-10 LAB — REFLEX - %FPSA
PSA, Free Pct: 20.4 %
PSA, Free: 1 ng/mL

## 2022-06-23 ENCOUNTER — Encounter (INDEPENDENT_AMBULATORY_CARE_PROVIDER_SITE_OTHER): Payer: Self-pay

## 2022-10-07 ENCOUNTER — Encounter (INDEPENDENT_AMBULATORY_CARE_PROVIDER_SITE_OTHER): Payer: Self-pay | Admitting: Internal Medicine

## 2022-10-07 ENCOUNTER — Institutional Professional Consult (permissible substitution) (INDEPENDENT_AMBULATORY_CARE_PROVIDER_SITE_OTHER): Payer: BC Managed Care – HMO | Admitting: Internal Medicine

## 2022-10-07 VITALS — BP 117/73 | HR 70 | Temp 98.2°F | Wt 149.0 lb

## 2022-10-07 DIAGNOSIS — R972 Elevated prostate specific antigen [PSA]: Secondary | ICD-10-CM

## 2022-10-07 DIAGNOSIS — N401 Enlarged prostate with lower urinary tract symptoms: Secondary | ICD-10-CM

## 2022-10-07 DIAGNOSIS — R351 Nocturia: Secondary | ICD-10-CM

## 2022-10-07 NOTE — Progress Notes (Addendum)
Chief Complaint   Patient presents with    Pre-op Exam     5/6 prostate biopsy       HPI    Andrew Boyle is a 62 y.o. male here for evaluation and treatment of the following concerns  Pt had low level PSA elevation and had MRi which has raised some concern ands is now to have biopsy. He feels well and has o chest pian or palpitations. He has no significant urinary symptoms.     The patient's past medical history, surgical history, family history, medications and allergies were reviewed.    Allergies   Allergen Reactions    Sulfa Antibiotics Rash    Ciprofloxacin Hives       Medications  Current Outpatient Medications   Medication Sig Dispense Refill    tadalafil (CIALIS) 5 MG tablet Take 1 tablet (5 mg) by mouth daily       No current facility-administered medications for this visit.     Wt Readings from Last 3 Encounters:   10/07/22 67.6 kg (149 lb)   06/03/22 68.9 kg (152 lb)   08/04/20 69.4 kg (153 lb)     Review of Systems  CONST: No weight change, no fevers/chills/sweats, fatigue, muscle aches, appetite loss  EARS: No hearing loss, tinnitus, pain or discharge.   NOSE: No congestion, runny nose or bloody nose  NECK: No swollen glands, stiffness or pain  CV: No CP, palpitations, leg swelling, changes in exercise tolerance, PND, orthopnea  RESP: No SOB, cough, wheeze, asthma  GI: No n/v, diarrhea, constipation, abd pain, heartburn, blood in stool, early satiety  MSK:  No joint or muscle pain, swelling or weakness  SKIN:   No rashes lumps, sores, concerning moles, or changes in hair or nails.  HEME:  No bruising/bleeding, swollen glands  NEURO:  No HA, LH, dizziness, LOC, numbness/tingling, weakness  PSYCH:  No depressed mood, anhedonia, anxiety    Physical Exam:  BP 117/73   Pulse 70   Temp 98.2 F (36.8 C) (Temporal)   Wt 67.6 kg (149 lb)   SpO2 98%   BMI 23.34 kg/m   Wt Readings from Last 3 Encounters:   10/07/22 67.6 kg (149 lb)   06/03/22 68.9 kg (152 lb)   08/04/20 69.4 kg (153 lb)     GEN:  WD WNalert and appropriate in NAD  HENT: NCAT, TMs normal bilaterally, no nasal congestion, OP normal w no exudate or erythema  EYES: PERRL, EOMI, no pallor or scleral icterus, normal conjunctiva  LYMPH: no cervical or supraclavicular LAD appreciated  CV: RRR, no m/r/g.  No LE edema.  2+ radial pulses present and equal.  RESP: CTAB, normal effort, no rhonchi or rales  GI: soft, nontender/ nondistended, NABS, no rebound or guarding  SKIN: warm, dry.  no visible rashes or concerning moles  MSK: Normal bulk and tone, normal strength 5/5 and sensation UE / LE  NEURO: No CN deficits noted (II-XII) PERRLA, EOMI, face symmetric, hearing intact to voice, palate raise, shoulder shrug and neck flexion intact, tongue midline. MAE.  PSYCH: normal mood and affect    Assessment:  1. Abnormal PSA  - ECG 12 lead  - CBC and differential  - Comprehensive metabolic panel  - Urinalysis  - Urine culture  - Prothrombin time/INR    2. Benign prostatic hyperplasia with nocturia  - ECG 12 lead  - CBC and differential  - Comprehensive metabolic panel  - Urinalysis  - Urine culture  -  Prothrombin time/INR        Plan:  Cleared for surgery/biopsy      Carlota Raspberry, M.D.

## 2022-10-07 NOTE — Progress Notes (Signed)
Have you seen any specialists since your last visit with us?  Yes      The patient was informed that the following HM items are still outstanding:   nothing at this time, HM is up-to-date.

## 2022-10-08 LAB — COMPREHENSIVE METABOLIC PANEL
ALT: 16 IU/L (ref 0–44)
AST (SGOT): 17 IU/L (ref 0–40)
Albumin/Globulin Ratio: 1.7 (ref 1.2–2.2)
Albumin: 4.5 g/dL (ref 3.9–4.9)
Alkaline Phosphatase: 87 IU/L (ref 44–121)
BUN / Creatinine Ratio: 23 (ref 10–24)
BUN: 22 mg/dL (ref 8–27)
Bilirubin, Total: 0.5 mg/dL (ref 0.0–1.2)
CO2: 23 mmol/L (ref 20–29)
Calcium: 9.5 mg/dL (ref 8.6–10.2)
Chloride: 104 mmol/L (ref 96–106)
Creatinine: 0.96 mg/dL (ref 0.76–1.27)
Globulin, Total: 2.6 g/dL (ref 1.5–4.5)
Glucose: 89 mg/dL (ref 70–99)
Potassium: 5.3 mmol/L — ABNORMAL HIGH (ref 3.5–5.2)
Protein, Total: 7.1 g/dL (ref 6.0–8.5)
Sodium: 142 mmol/L (ref 134–144)
eGFR: 89 mL/min/{1.73_m2} (ref >59)

## 2022-10-08 LAB — URINALYSIS
Bilirubin, UA: NEGATIVE
Blood, UA: NEGATIVE
Glucose, UA: NEGATIVE
Ketones UA: NEGATIVE
Leukocyte Esterase, UA: NEGATIVE
Nitrite, UA: NEGATIVE
Protein, UR: NEGATIVE
Specific Gravity UA: 1.022 (ref 1.005–1.030)
Urine pH: 7 (ref 5.0–7.5)
Urobilinogen, UA: 0.2 mg/dL (ref 0.2–1.0)

## 2022-10-08 LAB — CBC AND DIFFERENTIAL
Baso(Absolute): 0 10*3/uL (ref 0.0–0.2)
Basophils Automated: 1 %
Eosinophils Absolute: 0.2 10*3/uL (ref 0.0–0.4)
Eosinophils Automated: 4 %
Hematocrit: 46.1 % (ref 37.5–51.0)
Hemoglobin: 15.1 g/dL (ref 13.0–17.7)
Immature Granulocytes Absolute: 0 10*3/uL (ref 0.0–0.1)
Immature Granulocytes: 0 %
Lymphocytes Absolute: 1.9 10*3/uL (ref 0.7–3.1)
Lymphocytes Automated: 33 %
MCH: 28.9 pg (ref 26.6–33.0)
MCHC: 32.8 g/dL (ref 31.5–35.7)
MCV: 88 fL (ref 79–97)
Monocytes Absolute: 0.4 10*3/uL (ref 0.1–0.9)
Monocytes: 7 %
Neutrophils Absolute Count: 3.1 10*3/uL (ref 1.4–7.0)
Neutrophils: 55 %
Platelets: 210 10*3/uL (ref 150–450)
RBC: 5.23 x10E6/uL (ref 4.14–5.80)
RDW: 13.6 % (ref 11.6–15.4)
WBC: 5.7 10*3/uL (ref 3.4–10.8)

## 2022-10-08 LAB — PT/INR
PT INR: 1 (ref 0.9–1.2)
PT: 11 s (ref 9.1–12.0)

## 2022-10-09 LAB — CULTURE, URINE: Result 1:: NO GROWTH

## 2023-02-15 ENCOUNTER — Encounter (INDEPENDENT_AMBULATORY_CARE_PROVIDER_SITE_OTHER): Payer: Self-pay
